# Patient Record
Sex: Female | Born: 1935 | Hispanic: No | Marital: Married | Smoking: Never smoker
Health system: Southern US, Community
[De-identification: ages and names within clinical notes are randomized; demographics above are authoritative.]

## PROBLEM LIST (undated history)

## (undated) DIAGNOSIS — H409 Unspecified glaucoma: Secondary | ICD-10-CM

## (undated) DIAGNOSIS — I4891 Unspecified atrial fibrillation: Secondary | ICD-10-CM

## (undated) DIAGNOSIS — R0602 Shortness of breath: Secondary | ICD-10-CM

## (undated) DIAGNOSIS — R42 Dizziness and giddiness: Secondary | ICD-10-CM

## (undated) DIAGNOSIS — I10 Essential (primary) hypertension: Secondary | ICD-10-CM

## (undated) DIAGNOSIS — H269 Unspecified cataract: Secondary | ICD-10-CM

## (undated) DIAGNOSIS — R609 Edema, unspecified: Secondary | ICD-10-CM

## (undated) HISTORY — DX: Edema, unspecified: R60.9

## (undated) HISTORY — DX: Shortness of breath: R06.02

## (undated) HISTORY — DX: Dizziness and giddiness: R42

## (undated) HISTORY — DX: Unspecified atrial fibrillation: I48.91

---

## 2010-10-20 DEATH — deceased

## 2017-07-19 ENCOUNTER — Emergency Department (HOSPITAL_COMMUNITY)
Admission: EM | Admit: 2017-07-19 | Discharge: 2017-07-19 | Disposition: A | Discharge status: Home / Self Care | Payer: No Typology Code available for payment source | Attending: Emergency Medicine | Admitting: Emergency Medicine

## 2017-07-19 ENCOUNTER — Encounter (HOSPITAL_COMMUNITY): Payer: Self-pay | Admitting: Emergency Medicine

## 2017-07-19 ENCOUNTER — Emergency Department (HOSPITAL_COMMUNITY): Payer: No Typology Code available for payment source

## 2017-07-19 DIAGNOSIS — Y9389 Activity, other specified: Secondary | ICD-10-CM | POA: Diagnosis not present

## 2017-07-19 DIAGNOSIS — I10 Essential (primary) hypertension: Secondary | ICD-10-CM | POA: Insufficient documentation

## 2017-07-19 DIAGNOSIS — S0990XA Unspecified injury of head, initial encounter: Secondary | ICD-10-CM

## 2017-07-19 DIAGNOSIS — Y929 Unspecified place or not applicable: Secondary | ICD-10-CM | POA: Insufficient documentation

## 2017-07-19 DIAGNOSIS — Y998 Other external cause status: Secondary | ICD-10-CM | POA: Insufficient documentation

## 2017-07-19 DIAGNOSIS — S098XXA Other specified injuries of head, initial encounter: Secondary | ICD-10-CM | POA: Diagnosis not present

## 2017-07-19 DIAGNOSIS — R000 Tachycardia, unspecified: Secondary | ICD-10-CM | POA: Diagnosis not present

## 2017-07-19 DIAGNOSIS — S0101XA Laceration without foreign body of scalp, initial encounter: Secondary | ICD-10-CM | POA: Diagnosis not present

## 2017-07-19 DIAGNOSIS — Z7982 Long term (current) use of aspirin: Secondary | ICD-10-CM | POA: Insufficient documentation

## 2017-07-19 HISTORY — DX: Unspecified glaucoma: H40.9

## 2017-07-19 HISTORY — DX: Essential (primary) hypertension: I10

## 2017-07-19 HISTORY — DX: Unspecified cataract: H26.9

## 2017-07-19 LAB — URINALYSIS, ROUTINE W REFLEX MICROSCOPIC
BACTERIA UA: NONE SEEN
Bilirubin Urine: NEGATIVE
GLUCOSE, UA: NEGATIVE mg/dL
KETONES UR: NEGATIVE mg/dL
Leukocytes, UA: NEGATIVE
Nitrite: NEGATIVE
PROTEIN: 100 mg/dL — AB
Specific Gravity, Urine: 1.011 (ref 1.005–1.030)
pH: 7 (ref 5.0–8.0)

## 2017-07-19 LAB — CBC
HCT: 44.8 % (ref 36.0–46.0)
HEMOGLOBIN: 15.3 g/dL — AB (ref 12.0–15.0)
MCH: 30.5 pg (ref 26.0–34.0)
MCHC: 34.2 g/dL (ref 30.0–36.0)
MCV: 89.4 fL (ref 78.0–100.0)
Platelets: 215 10*3/uL (ref 150–400)
RBC: 5.01 MIL/uL (ref 3.87–5.11)
RDW: 13.6 % (ref 11.5–15.5)
WBC: 14.8 10*3/uL — ABNORMAL HIGH (ref 4.0–10.5)

## 2017-07-19 LAB — BASIC METABOLIC PANEL
Anion gap: 12 (ref 5–15)
BUN: 15 mg/dL (ref 6–20)
CHLORIDE: 97 mmol/L — AB (ref 101–111)
CO2: 23 mmol/L (ref 22–32)
CREATININE: 0.78 mg/dL (ref 0.44–1.00)
Calcium: 8.9 mg/dL (ref 8.9–10.3)
GFR calc Af Amer: >60 mL/min (ref >60)
GFR calc non Af Amer: >60 mL/min (ref >60)
GLUCOSE: 135 mg/dL — AB (ref 65–99)
Potassium: 3.6 mmol/L (ref 3.5–5.1)
SODIUM: 132 mmol/L — AB (ref 135–145)

## 2017-07-19 NOTE — ED Notes (Signed)
E-signature broke; patient verbalizes understanding of discharge instructions. No further questions at this time.

## 2017-07-19 NOTE — Discharge Instructions (Addendum)
Keep wound clean and follow-up with her primary doctor. Watch for signs of infection. Take Tylenol as needed for pain.  If you were given medicines take as directed.  If you are on coumadin or contraceptives realize their levels and effectiveness is altered by many different medicines.  If you have any reaction (rash, tongues swelling, other) to the medicines stop taking and see a physician.    If your blood pressure was elevated in the ER make sure you follow up for management with a primary doctor or return for chest pain, shortness of breath or stroke symptoms.  Please follow up as directed and return to the ER or see a physician for new or worsening symptoms.  Thank you. Vitals:   07/19/17 2030 07/19/17 2100 07/19/17 2130 07/19/17 2145  BP: (!) 163/107 (!) 163/86 (!) 147/101   Pulse: (!) 105 (!) 113 (!) 123 (!) 110  Resp: (!) 27 (!) 22 (!) 22 18  Temp:      SpO2: 95% 95% 95% 98%   Have heart rate rechecked by primary doctor Monday or Tuesday.
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
## 2019-01-04 ENCOUNTER — Other Ambulatory Visit: Payer: Self-pay | Admitting: Family Medicine

## 2019-01-04 ENCOUNTER — Other Ambulatory Visit: Payer: Self-pay

## 2019-01-04 ENCOUNTER — Ambulatory Visit
Admission: RE | Admit: 2019-01-04 | Discharge: 2019-01-04 | Disposition: A | Discharge status: Home / Self Care | Payer: Medicare Other | Source: Ambulatory Visit | Attending: Family Medicine | Admitting: Family Medicine

## 2019-01-04 DIAGNOSIS — R630 Anorexia: Secondary | ICD-10-CM

## 2019-01-04 DIAGNOSIS — R6881 Early satiety: Secondary | ICD-10-CM

## 2019-01-04 DIAGNOSIS — R0602 Shortness of breath: Secondary | ICD-10-CM

## 2020-05-27 IMAGING — CR DG CHEST 2V
2 series · 2 of 2 positions shown · non-contrast
Comparison: None.

CLINICAL DATA: Shortness of breath.

EXAM:
CHEST - 2 VIEW

[w chest pa]
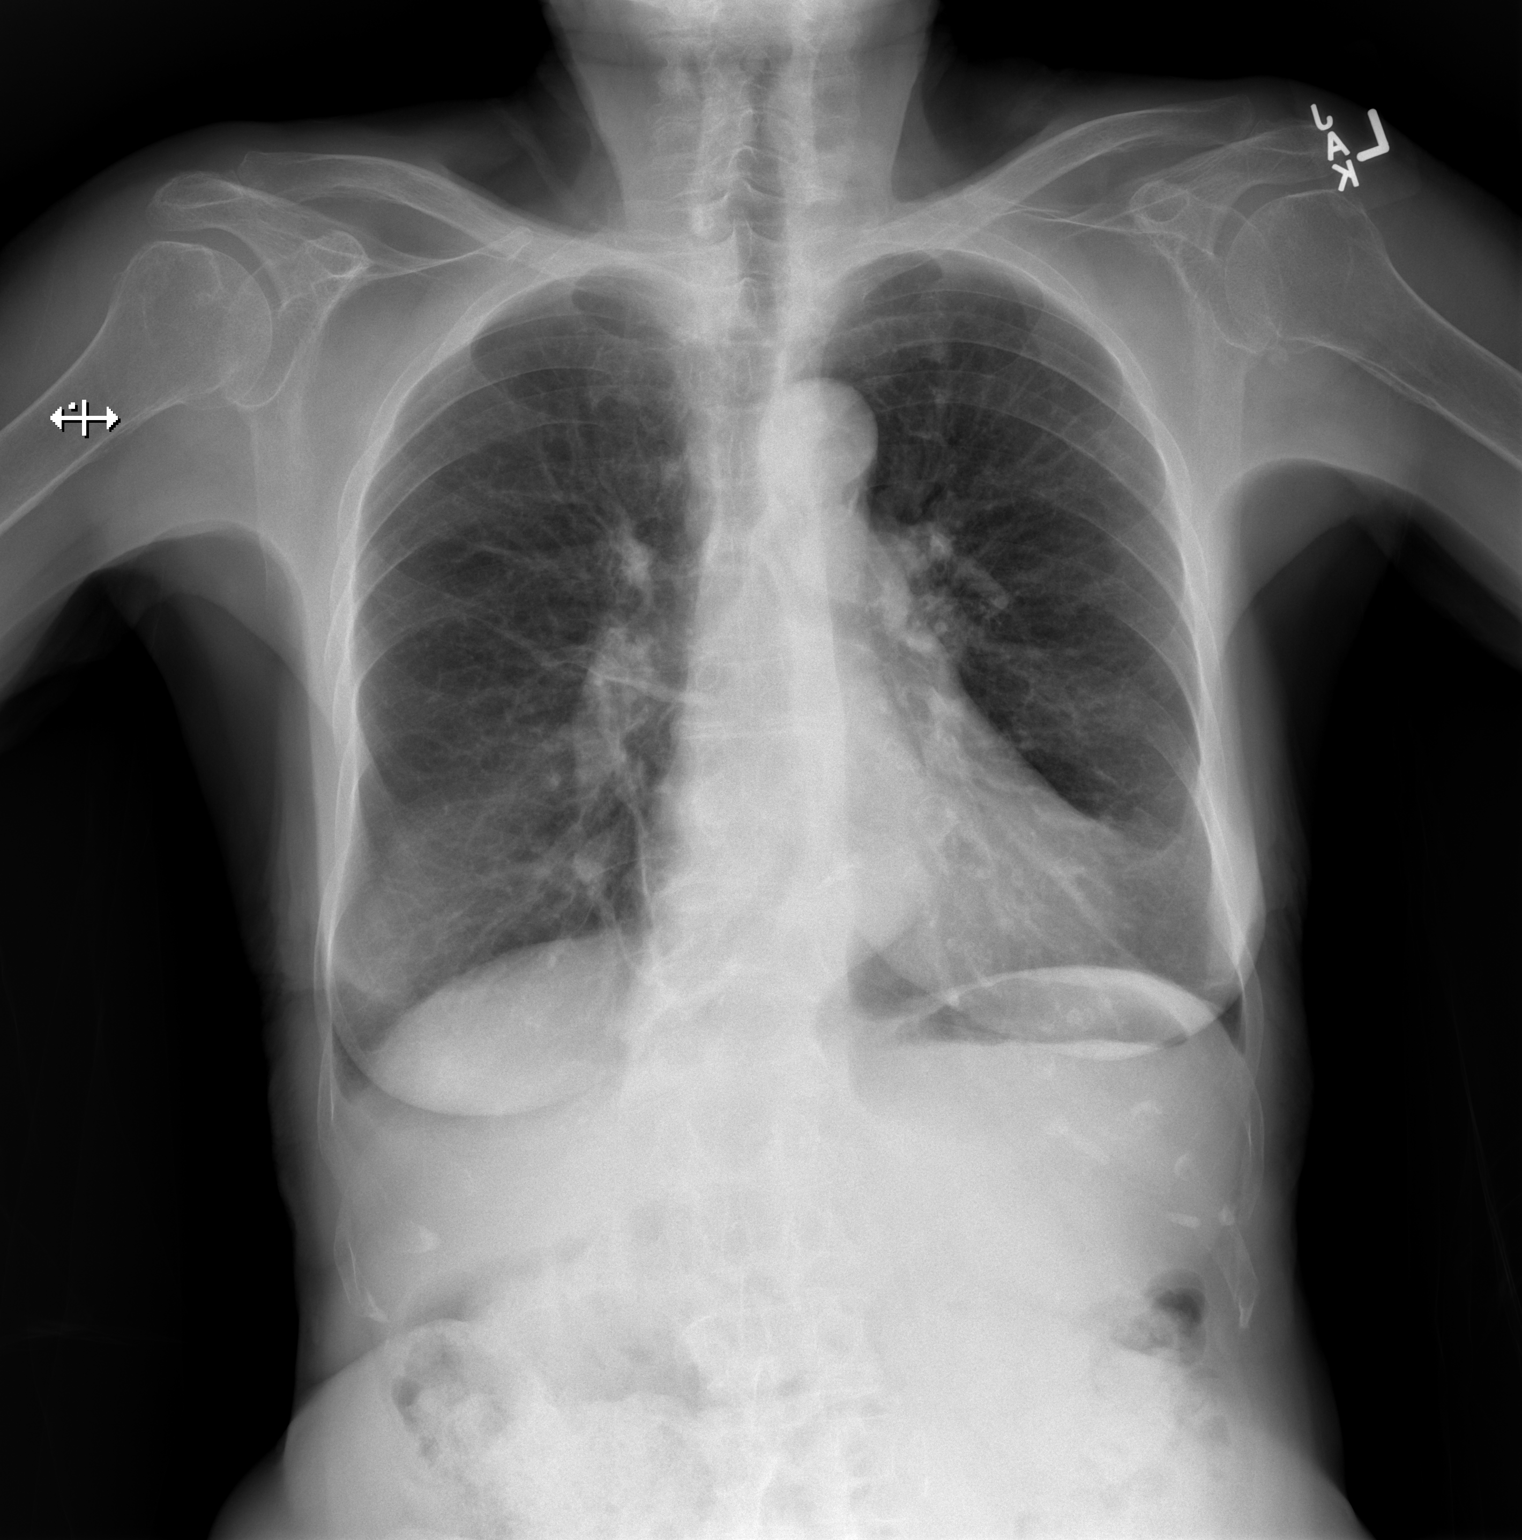

[w chest lat]
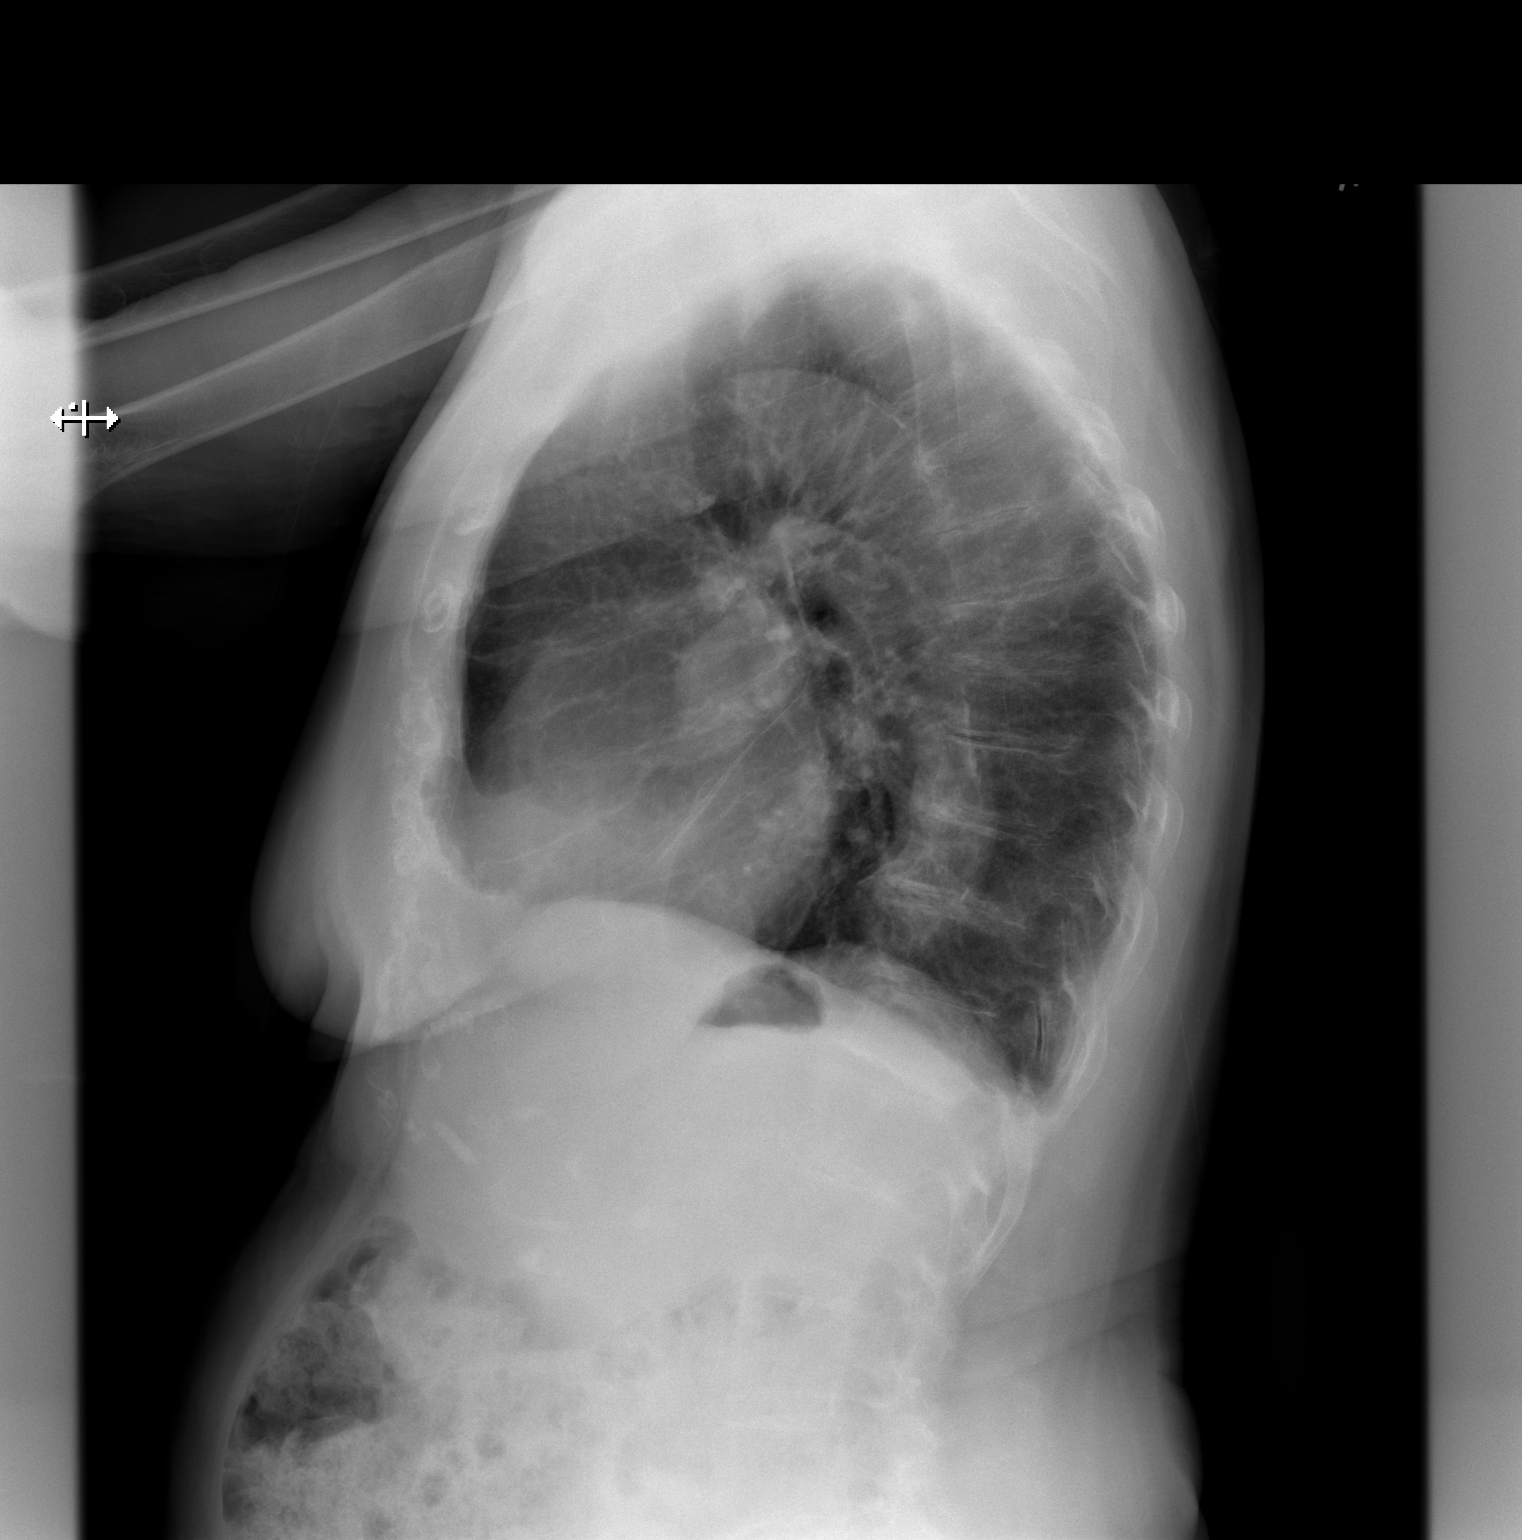

[2 of 2 positions shown; findings below may reference images not displayed]

FINDINGS: The heart size and pulmonary vascularity are normal. There is
tortuosity and calcification of the thoracic aorta. No infiltrates
or effusions. Slight accentuation of the thoracic kyphosis with
slight wedge deformities of several thoracic vertebra, age
indeterminate.
IMPRESSION: No acute cardiopulmonary disease. Slight wedge deformities of
several thoracic vertebra, age indeterminate.

## 2021-11-06 ENCOUNTER — Other Ambulatory Visit (HOSPITAL_COMMUNITY): Payer: Self-pay | Admitting: Family Medicine

## 2021-11-06 DIAGNOSIS — I4892 Unspecified atrial flutter: Secondary | ICD-10-CM

## 2021-11-08 ENCOUNTER — Telehealth: Payer: Self-pay

## 2021-11-08 NOTE — Telephone Encounter (Signed)
Notes scanned to referral 

## 2021-11-13 ENCOUNTER — Encounter (HOSPITAL_COMMUNITY): Payer: Self-pay

## 2021-11-13 ENCOUNTER — Other Ambulatory Visit (HOSPITAL_COMMUNITY): Payer: Medicare Other

## 2021-11-13 ENCOUNTER — Emergency Department (HOSPITAL_COMMUNITY): Payer: Medicare PPO

## 2021-11-13 ENCOUNTER — Observation Stay (HOSPITAL_COMMUNITY)
Admission: EM | Admit: 2021-11-13 | Discharge: 2021-11-14 | Disposition: A | Discharge status: Home / Self Care | Payer: Medicare PPO | Attending: Internal Medicine | Admitting: Internal Medicine

## 2021-11-13 ENCOUNTER — Other Ambulatory Visit: Payer: Self-pay

## 2021-11-13 DIAGNOSIS — I110 Hypertensive heart disease with heart failure: Secondary | ICD-10-CM | POA: Diagnosis not present

## 2021-11-13 DIAGNOSIS — I4891 Unspecified atrial fibrillation: Principal | ICD-10-CM | POA: Diagnosis present

## 2021-11-13 DIAGNOSIS — Z79899 Other long term (current) drug therapy: Secondary | ICD-10-CM | POA: Insufficient documentation

## 2021-11-13 DIAGNOSIS — I509 Heart failure, unspecified: Secondary | ICD-10-CM | POA: Diagnosis not present

## 2021-11-13 DIAGNOSIS — Z7901 Long term (current) use of anticoagulants: Secondary | ICD-10-CM | POA: Diagnosis not present

## 2021-11-13 DIAGNOSIS — R0602 Shortness of breath: Secondary | ICD-10-CM | POA: Diagnosis present

## 2021-11-13 LAB — BASIC METABOLIC PANEL
Anion gap: 12 (ref 5–15)
BUN: 13 mg/dL (ref 8–23)
CO2: 28 mmol/L (ref 22–32)
Calcium: 9 mg/dL (ref 8.9–10.3)
Chloride: 94 mmol/L — ABNORMAL LOW (ref 98–111)
Creatinine, Ser: 0.78 mg/dL (ref 0.44–1.00)
GFR, Estimated: >60 mL/min (ref >60)
Glucose, Bld: 136 mg/dL — ABNORMAL HIGH (ref 70–99)
Potassium: 3.4 mmol/L — ABNORMAL LOW (ref 3.5–5.1)
Sodium: 134 mmol/L — ABNORMAL LOW (ref 135–145)

## 2021-11-13 LAB — TROPONIN I (HIGH SENSITIVITY)
Troponin I (High Sensitivity): 16 ng/L (ref <18)
Troponin I (High Sensitivity): 15 ng/L (ref <18)

## 2021-11-13 LAB — CBC
HCT: 43.8 % (ref 36.0–46.0)
Hemoglobin: 15 g/dL (ref 12.0–15.0)
MCH: 30.7 pg (ref 26.0–34.0)
MCHC: 34.2 g/dL (ref 30.0–36.0)
MCV: 89.8 fL (ref 80.0–100.0)
Platelets: 309 10*3/uL (ref 150–400)
RBC: 4.88 MIL/uL (ref 3.87–5.11)
RDW: 13.5 % (ref 11.5–15.5)
WBC: 13 10*3/uL — ABNORMAL HIGH (ref 4.0–10.5)
nRBC: 0 % (ref 0.0–0.2)

## 2021-11-13 LAB — MAGNESIUM: Magnesium: 2.1 mg/dL (ref 1.7–2.4)

## 2021-11-13 LAB — BRAIN NATRIURETIC PEPTIDE: B Natriuretic Peptide: 273.9 pg/mL — ABNORMAL HIGH (ref 0.0–100.0)

## 2021-11-13 LAB — TSH: TSH: 0.704 u[IU]/mL (ref 0.350–4.500)

## 2021-11-13 MED ORDER — ACETAMINOPHEN 325 MG PO TABS: 650.0000 mg | ORAL_TABLET | Freq: Four times a day (QID) | ORAL | Status: DC | PRN

## 2021-11-13 MED ORDER — DILTIAZEM HCL-DEXTROSE 125-5 MG/125ML-% IV SOLN (PREMIX)
5.0000 mg/h | INTRAVENOUS | Status: DC
  Administered 2021-11-13 (×2): 5 mg/h via INTRAVENOUS
  Administered 2021-11-14: 7.5 mg/h via INTRAVENOUS
  Filled 2021-11-13 (×2): qty 125

## 2021-11-13 MED ORDER — DILTIAZEM LOAD VIA INFUSION
10.0000 mg | Freq: Once | INTRAVENOUS | Status: AC
  Administered 2021-11-13: 10 mg via INTRAVENOUS
  Filled 2021-11-13: qty 10

## 2021-11-13 MED ORDER — HYDRALAZINE HCL 20 MG/ML IJ SOLN: 10.0000 mg | Freq: Four times a day (QID) | INTRAMUSCULAR | Status: DC | PRN

## 2021-11-13 MED ORDER — ALBUTEROL SULFATE (2.5 MG/3ML) 0.083% IN NEBU
2.5000 mg | INHALATION_SOLUTION | Freq: Four times a day (QID) | RESPIRATORY_TRACT | Status: DC
  Filled 2021-11-13: qty 3

## 2021-11-13 MED ORDER — APIXABAN 2.5 MG PO TABS
2.5000 mg | ORAL_TABLET | Freq: Two times a day (BID) | ORAL | Status: DC
Start: 2021-11-13 — End: 2021-11-14
  Filled 2021-11-13 (×2): qty 1

## 2021-11-13 MED ORDER — POTASSIUM CHLORIDE 20 MEQ PO PACK
40.0000 meq | PACK | Freq: Once | ORAL | Status: AC
  Administered 2021-11-13: 40 meq via ORAL
  Filled 2021-11-13: qty 2

## 2021-11-13 MED ORDER — METOPROLOL SUCCINATE ER 25 MG PO TB24: 12.5000 mg | ORAL_TABLET | Freq: Every day | ORAL | Status: DC

## 2021-11-13 MED ORDER — METOPROLOL TARTRATE 12.5 MG HALF TABLET
12.5000 mg | ORAL_TABLET | Freq: Two times a day (BID) | ORAL | Status: DC
  Administered 2021-11-13 – 2021-11-14 (×2): 12.5 mg via ORAL
  Filled 2021-11-13 (×2): qty 1

## 2021-11-13 MED ORDER — ACETAMINOPHEN 650 MG RE SUPP: 650.0000 mg | Freq: Four times a day (QID) | RECTAL | Status: DC | PRN

## 2021-11-13 MED ORDER — PILOCARPINE HCL 2 % OP SOLN
1.0000 [drp] | Freq: Four times a day (QID) | OPHTHALMIC | Status: DC
Start: 2021-11-13 — End: 2021-11-14
  Administered 2021-11-14: 1 [drp] via OPHTHALMIC
  Filled 2021-11-13: qty 15

## 2021-11-13 MED ORDER — FUROSEMIDE 10 MG/ML IJ SOLN
20.0000 mg | Freq: Once | INTRAMUSCULAR | Status: AC
  Administered 2021-11-13: 20 mg via INTRAVENOUS
  Filled 2021-11-13: qty 2

## 2021-11-13 MED ORDER — ALBUTEROL SULFATE (2.5 MG/3ML) 0.083% IN NEBU: 2.5000 mg | INHALATION_SOLUTION | RESPIRATORY_TRACT | Status: DC | PRN

## 2021-11-13 NOTE — ED Notes (Signed)
Requested purple man from [facility]

## 2021-11-13 NOTE — ED Provider Notes (Signed)
  Physical Exam  BP 136/88   Pulse 89   Temp 98.9 F (37.2 C) (Oral)   Resp (!) 21   Ht 4\' 9"  (1.448 m)   Wt 50.8 kg   SpO2 97%   BMI 24.24 kg/m   Physical Exam  Procedures  Procedures  ED Course / MDM      Received care of patient from Dr. .  Please see his note for prior history, physical and care.  Briefly this is a 88 year old female with a history of hypertension, CHF, and atrial fibrillation on Eliquis who presents with concern for shortness of breath, fatigue and chest pain.  On presentation she is in atrial fibrillation with RVR with started on a diltiazem drip.  Labs returned and were also personally eval by me and showed mild hypokalemia, normal creatinine, white blood cell count of 13,000, no anemia, normal troponin.  Chest x-ray with small bilateral pleural effusions and bibasilar opacities.  Admitted for continued care of atrial fibrillation, dyspnea, cp.      94, MD 11/14/21 [phone]

## 2021-11-13 NOTE — ED Provider Notes (Addendum)
[facility] EMERGENCY DEPARTMENT Provider Note   CSN: [id number] Arrival date & time: 11/13/21  1253     History  Chief Complaint  Patient presents with   Shortness of Breath   Chest Pain    Stacy Ortega is a 88 y.o. female.   Shortness of Breath Associated symptoms: chest pain   Chest Pain Associated symptoms: fatigue and shortness of breath   Presents for chest pain and shortness of breath.  Medical history includes HTN, CHF, and atrial fibrillation.  She has had ongoing shortness of breath, worsened with exertion and laying flat.  She has been prescribed Lasix, Eliquis, and metoprolol.  These are recent prescriptions.  She states that she does not like taking them.  She has been taking her Lasix and has not experienced any relief of her symptoms.  Last night, she experienced left-sided chest pain and shortness of breath.  Chest pain radiated into her left arm.  Symptoms have persisted today.  Currently, she denies any pain or areas of discomfort.  She feels that her shortness of breath is baseline.  Patient presents to the ED with her daughters who assist in providing history.  Patient is very clear that she would like to be DNR and has no intention of staying in the hospital tonight.     Home Medications Prior to Admission medications   Medication Sig Start Date End Date Taking? Authorizing Provider  amLODipine (NORVASC) 2.5 MG tablet Take 2.5 mg by mouth as needed.    [provider]  aspirin 325 MG tablet Take 325 mg by mouth at bedtime.    [provider]  latanoprost (XALATAN) 0.005 % ophthalmic solution Place 1 drop into both eyes at bedtime. 06/10/17   [provider]  lisinopril (PRINIVIL,ZESTRIL) 20 MG tablet Take 20 mg by mouth daily. 05/11/17   [provider]  pilocarpine (PILOCAR) 2 % ophthalmic solution Place 1 drop into both eyes 4 (four) times daily. 06/26/17   [provider]      Allergies     Amoxicillin, Codeine, and Morphine and related    Review of Systems   Review of Systems  Constitutional:  Positive for fatigue.  Respiratory:  Positive for shortness of breath.   Cardiovascular:  Positive for chest pain and leg swelling.  All other systems reviewed and are negative.   Physical Exam Updated Vital Signs BP 125/84   Pulse (!) 106   Temp 98.9 F (37.2 C) (Oral)   Resp (!) 25   Ht 4\' 9"  (1.448 m)   Wt 50.8 kg   SpO2 97%   BMI 24.24 kg/m  Physical Exam Vitals and nursing note reviewed.  Constitutional:      General: She is not in acute distress.    Appearance: She is well-developed and normal weight. She is not ill-appearing, toxic-appearing or diaphoretic.  HENT:     Head: Normocephalic and atraumatic.     Mouth/Throat:     Mouth: Mucous membranes are moist.     Pharynx: Oropharynx is clear.  Eyes:     Conjunctiva/sclera: Conjunctivae normal.  Cardiovascular:     Rate and Rhythm: Tachycardia present. Rhythm irregular.     Heart sounds: No murmur heard. Pulmonary:     Effort: Pulmonary effort is normal. Tachypnea present. No respiratory distress.     Breath sounds: Rales present. No decreased breath sounds, wheezing or rhonchi.  Chest:     Chest wall: No tenderness.  Abdominal:  Palpations: Abdomen is soft.     Tenderness: There is no abdominal tenderness.  Musculoskeletal:        General: No swelling.     Cervical back: Normal range of motion and neck supple.     Right lower leg: Edema present.     Left lower leg: Edema present.  Skin:    General: Skin is warm and dry.     Capillary Refill: Capillary refill takes less than 2 seconds.     Coloration: Skin is not cyanotic or pale.  Neurological:     General: No focal deficit present.     Mental Status: She is alert and oriented to person, place, and time.  Psychiatric:        Mood and Affect: Mood normal.        Behavior: Behavior normal.     ED Results / Procedures / Treatments    Labs (all labs ordered are listed, but only abnormal results are displayed) Labs Reviewed  BASIC METABOLIC PANEL - Abnormal; Notable for the following components:      Result Value   Sodium 134 (*)    Potassium 3.4 (*)    Chloride 94 (*)    Glucose, Bld 136 (*)    All other components within normal limits  CBC - Abnormal; Notable for the following components:   WBC 13.0 (*)    All other components within normal limits  BRAIN NATRIURETIC PEPTIDE  MAGNESIUM  TSH  TROPONIN I (HIGH SENSITIVITY)  TROPONIN I (HIGH SENSITIVITY)    EKG EKG Interpretation  Date/Time:  Wednesday November 13 2021 13:55:45 EDT Ventricular Rate:  155 PR Interval:    QRS Duration: 66 QT Interval:  270 QTC Calculation: 433 R Axis:   72 Text Interpretation: Atrial fibrillation w/RVR Septal infarct , age undetermined Abnormal ECG Confirmed by Godfrey Pick [phone]) on 11/13/2021 2:47:11 PM  Radiology DG Chest 2 View  Result Date: 11/13/2021 CLINICAL DATA:  Shortness of breath EXAM: CHEST - 2 VIEW COMPARISON:  01/04/2019 FINDINGS: Cardiomegaly. Small bilateral pleural effusions, left greater than right, with associated bibasilar opacities. No pneumothorax. IMPRESSION: Small bilateral pleural effusions, left greater than right, with associated bibasilar opacities. Electronically Signed   By: Davina Poke D.O.   On: 11/13/2021 14:30    Procedures Procedures    Medications Ordered in ED Medications  diltiazem (CARDIZEM) 1 mg/mL load via infusion 10 mg (10 mg Intravenous Bolus from Bag 11/13/21 1542)    And  diltiazem (CARDIZEM) 125 mg in dextrose 5% 125 mL (1 mg/mL) infusion (7.5 mg/hr Intravenous Rate/Dose Change 11/13/21 1643)  potassium chloride (KLOR-CON) packet 40 mEq (has no administration in time range)  furosemide (LASIX) injection 20 mg (has no administration in time range)    ED Course/ Medical Decision Making/ A&P                           Medical Decision Making Amount and/or Complexity of Data  Reviewed Labs: ordered. Radiology: ordered.  Risk Prescription drug management.   This patient presents to the ED for concern of shortness of breath, this involves an extensive number of treatment options, and is a complaint that carries with it a high risk of complications and morbidity.  The differential diagnosis includes pulm edema, CHF, valvular dysfunction, atrial fibrillation, ACS, pneumonia   Co morbidities that complicate the patient evaluation  HTN, CHF, and atrial fibrillation   Additional history obtained:  Additional history obtained from patient's  daughters External records from outside source obtained and reviewed including EMR   Lab Tests:  I Ordered, and personally interpreted labs.  The pertinent results include: Hemoglobin is normal.  There is a leukocytosis of 13.  Potassium is slightly low.  Initial troponin is normal.   Imaging Studies ordered:  I ordered imaging studies including chest x-ray I independently visualized and interpreted imaging which showed small bilateral pleural effusions I agree with the radiologist interpretation   Cardiac Monitoring: / EKG:  The patient was maintained on a cardiac monitor.  I personally viewed and interpreted the cardiac monitored which showed an underlying rhythm of: Atrial fibrillation with RVR  Problem List / ED Course / Critical interventions / Medication management  Patient is a very pleasant 88 year old female presenting for shortness of breath.  This has been an ongoing issue for her.  She is seen by her primary care doctor and has recently been prescribed Eliquis for atrial fibrillation, Lasix for diuresis, and metoprolol for CHF and A-fib.  She did have an episode of chest pain last night that radiated into her left arm.  Currently, she denies any discomfort but does endorse her ongoing shortness of breath that she does not feel has worsened.  Vital signs on arrival are notable for tachycardia.  EKG does show  evidence of atrial fibrillation with RVR.  She is mildly tachypneic.  Fortunately, her blood pressures are normal.  Patient is able to speak in complete sentences.  She is a joy to talk to.  Initially, patient was hesitant to receive any medical care.  She stated that she has lived a good life and does not want to be treated with medications or in the hospital.  After discussion regarding her rapid heart rate at this time, patient was agreeable to initiation of Cardizem for rate control.  Laboratory work-up was initiated.  Labs show mild hypokalemia.  Replacement potassium was ordered in addition to Lasix for diuresis.  She does have dilated atria on bedside echocardiogram.  X-ray also shows some bilateral pleural effusions.  I feel that patient would benefit from continued diuresis and continued rate control.  After further discussions, she was amenable to an overnight admission for observation and continued management.  Following initiation of Cardizem, patient did have improved heart rate.  She also reported improved symptoms.  Plan will be for admission.  Care of patient was signed out to oncoming ED provider. I ordered medication including Cardizem for rate control; potassium chloride for hypokalemia; Lasix for diuresis Reevaluation of the patient after these medicines showed that the patient improved I have reviewed the patients home medicines and have made adjustments as needed   Social Determinants of Health:  Has primary care doctor  CRITICAL CARE Performed by: Gloris Manchester   Total critical care time: 35 minutes  Critical care time was exclusive of separately billable procedures and treating other patients.  Critical care was necessary to treat or prevent imminent or life-threatening deterioration.  Critical care was time spent personally by me on the following activities: development of treatment plan with patient and/or surrogate as well as nursing, discussions with consultants,  evaluation of patient's response to treatment, examination of patient, obtaining history from patient or surrogate, ordering and performing treatments and interventions, ordering and review of laboratory studies, ordering and review of radiographic studies, pulse oximetry and re-evaluation of patient's condition.         Final Clinical Impression(s) / ED Diagnoses Final diagnoses:  Shortness of breath  Atrial  fibrillation with RVR [facility])    Rx / DC Orders ED Discharge Orders          Ordered    Amb referral to AFIB Clinic        11/13/21 1439                 Godfrey Pick, MD 11/13/21 1649

## 2021-11-13 NOTE — ED Notes (Signed)
Sob with exertion

## 2021-11-13 NOTE — H&P (Signed)
[facility] History and Physical  Stacy Ortega HKV:[id number] DOB: 05/07/1922 DOA: 11/13/2021 PCP: Kaleen Mask, MD  Admitted from: Home Chief Complaint: Chest pain  History of Present Illness: Stacy Ortega is a 88 y.o. female with history of hypertension, CHF, glaucoma, cataract.  Patient presented to the ED from home today for evaluation of shortness of breath, left-sided chest pain.  Per report, patient became short of breath last night while in bed.  She also had chest pain radiating up to her jaw. She reports chronic shortness of breath but last night and orthopnea was more intense.  In the ED, patient had heart rate elevated up to 161, blood pressure elevated to 164/116, breathing on room air with O2 sat at 95% Labs showed sodium at 134, potassium 3.4, BNP elevated to 274, WBC count slightly elevated to 13. Chest x-ray showed small bilateral pleural effusions, left greater than right, with associated bibasilar opacities.  Patient was given a dose of IV Lasix 20 mg and started on Cardizem drip.  At the time of my evaluation, patient was propped up in bed.  Alert, awake, oriented x3.  Little hard of hearing.  Daughter was at bedside and helped with the history. Patient had not seen a physician for several years.  She was taking lisinopril and aspirin 325 mg daily. Patient was recently seen by her primary care provider and was noted to be in A-fib.  She was also noted to have pedal edema.  She was switched to metoprolol succinate 12.5 mg daily, Eliquis 2.5 twice daily and Lasix 20 mg daily.  Patient does not like these medications but she was compliant to them anyway.   Review of Systems:  All systems were reviewed and were negative unless otherwise mentioned in the HPI   Past medical history: Past Medical History:  Diagnosis Date   Cataract    Glaucoma    Hypertension     Past surgical history: History reviewed. No pertinent surgical history.  Social History:   reports that she has never smoked. She has never used smokeless tobacco. She reports that she does not currently use alcohol. She reports that she does not currently use drugs.  Allergies:  Allergies  Allergen Reactions   Amoxicillin Anaphylaxis   Codeine Nausea And Vomiting   Morphine And Related Nausea And Vomiting   Amoxicillin, Codeine, and Morphine and related   Family history:  History reviewed. No pertinent family history.   Home Meds: Prior to Admission medications   Medication Sig Start Date End Date Taking? Authorizing Provider  ELIQUIS 2.5 MG TABS tablet Take 2.5 mg by mouth every 12 (twelve) hours. 11/01/21  Yes [provider]  furosemide (LASIX) 20 MG tablet Take 20 mg by mouth daily. 11/08/21  Yes [provider]  latanoprost (XALATAN) 0.005 % ophthalmic solution Place 1 drop into both eyes at bedtime. 06/10/17  Yes [provider]  metoprolol succinate (TOPROL-XL) 25 MG 24 hr tablet Take 12.5 mg by mouth daily. 11/01/21  Yes [provider]  pilocarpine (PILOCAR) 2 % ophthalmic solution Place 1 drop into both eyes 4 (four) times daily. 06/26/17  Yes [provider]    Physical Exam: Vitals:   11/13/21 1515 11/13/21 1545 11/13/21 1600 11/13/21 1630  BP: (!) 146/107 136/88 136/85 125/84  Pulse: (!) 160 89 (!) 106 (!) 106  Resp: (!) 23 (!) 21 (!) 27 (!) 25  Temp:      TempSrc:      SpO2: 98% 97% 98%  97%  Weight:      Height:       Wt Readings from Last 3 Encounters:  11/13/21 50.8 kg   Body mass index is 24.24 kg/m.  General exam: Pleasant, elderly Caucasian female.  No chest pain at the time of my evaluation Skin: No rashes, lesions or ulcers. HEENT: Atraumatic, normocephalic, no obvious bleeding Lungs: Clear to auscultate bilaterally CVS: A-fib with RVR in 120s GI/Abd soft, nontender, nondistended, bowel sound present CNS: Alert, awake abound x3 Psychiatry: Mood appropriate Extremities: Trace bilateral pedal  edema     Consult Orders  (From admission, onward)           Start     Ordered   11/13/21 1647  Consult to hospitalist  Once       Provider:  (Not yet assigned)  Question Answer Comment  Place call to: [facility]   Reason for Consult Admit      11/13/21 1646            Labs on Admission:   CBC: Recent Labs  Lab 11/13/21 1403  WBC 13.0*  HGB 15.0  HCT 43.8  MCV 89.8  PLT [id number]    Basic Metabolic Panel: Recent Labs  Lab 11/13/21 1403 11/13/21 1439  NA 134*  --   K 3.4*  --   CL 94*  --   CO2 28  --   GLUCOSE 136*  --   BUN 13  --   CREATININE 0.78  --   CALCIUM 9.0  --   MG  --  2.1    Liver Function Tests: No results for input(s): "AST", "ALT", "ALKPHOS", "BILITOT", "PROT", "ALBUMIN" in the last 168 hours. No results for input(s): "LIPASE", "AMYLASE" in the last 168 hours. No results for input(s): "AMMONIA" in the last 168 hours.  Cardiac Enzymes: No results for input(s): "CKTOTAL", "CKMB", "CKMBINDEX", "TROPONINI" in the last 168 hours.  BNP (last 3 results) Recent Labs    11/13/21 1403  BNP 273.9*    ProBNP (last 3 results) No results for input(s): "PROBNP" in the last 8760 hours.  CBG: No results for input(s): "GLUCAP" in the last 168 hours.  Lipase  No results found for: "LIPASE"   Urinalysis    Component Value Date/Time   COLORURINE STRAW (A) 07/19/2017 2156   APPEARANCEUR CLEAR 07/19/2017 2156   LABSPEC 1.011 07/19/2017 2156   PHURINE 7.0 07/19/2017 2156   GLUCOSEU NEGATIVE 07/19/2017 2156   HGBUR SMALL (A) 07/19/2017 2156   [place] NEGATIVE 07/19/2017 2156   [place] NEGATIVE 07/19/2017 2156   PROTEINUR 100 (A) 07/19/2017 2156   NITRITE NEGATIVE 07/19/2017 2156   LEUKOCYTESUR NEGATIVE 07/19/2017 2156     Drugs of Abuse  No results found for: "LABOPIA", "COCAINSCRNUR", "LABBENZ", "AMPHETMU", "THCU", "LABBARB"    Radiological Exams on Admission: DG Chest 2 View  Result Date: 11/13/2021 CLINICAL DATA:   Shortness of breath EXAM: CHEST - 2 VIEW COMPARISON:  01/04/2019 FINDINGS: Cardiomegaly. Small bilateral pleural effusions, left greater than right, with associated bibasilar opacities. No pneumothorax. IMPRESSION: Small bilateral pleural effusions, left greater than right, with associated bibasilar opacities. Electronically Signed   By: Davina Poke D.O.   On: 11/13/2021 14:30     ------------------------------------------------------------------------------------------------------ Assessment/Plan: Principal Problem:   Atrial fibrillation with RVR (HCC)  A-fib with RVR -Started having chest pain, shortness of breath last night.  Probably it was the time of onset of RVR. -PTA on metoprolol succinate 12.5 mg daily and Eliquis 2.5 L twice daily -  In the ED, she was started on Cardizem drip.  I will continue the same for now.  I will also start her on metoprolol tartrate 12.5 mg twice daily -On Eliquis for anticoagulation.  Essential hypertension -Continue to monitor blood pressure while on Cardizem drip, metoprolol. -Hydralazine as needed  Impaired mobility -PT eval  Goals of care - -  Code Status: DNR    Diet:  Diet Order             Diet Heart Room service appropriate? Yes; Fluid consistency: Thin  Diet effective now                  DVT prophylaxis:  apixaban (ELIQUIS) tablet 2.5 mg Start: 11/13/21 2200 apixaban (ELIQUIS) tablet 2.5 mg   Antimicrobials:  Fluid: None Consultants: None Family Communication: Daughter at bedside Dispo: The patient is from: Home              Anticipated d/c is to: Home hopefully tomorrow    ------------------------------------------------------------------------------------- Severity of Illness: The appropriate patient status for this patient is OBSERVATION. Observation status is judged to be reasonable and necessary in order to provide the required intensity of service to ensure the patient's safety. The patient's presenting  symptoms, physical exam findings, and initial radiographic and laboratory data in the context of their medical condition is felt to place them at decreased risk for further clinical deterioration. Furthermore, it is anticipated that the patient will be medically stable for discharge from the hospital within 2 midnights of admission.   -------------------------------------------------------------------------------------  Signed, Lorin Glass, MD [facility] 11/13/2021

## 2021-11-13 NOTE — ED Provider Triage Note (Signed)
Emergency Medicine Provider Triage Evaluation Note  Stacy Ortega , a 88 y.o. female  was evaluated in triage.  Pt complains of chest pain and shortness of breath.  States that same began last night.  States she was recently started on Lasix and Eliquis for A-fib and CHF.  Daughter is present in the room states that she does not like taking medication and so she does not always take either of these medications.  States she suspects she is taking her Eliquis once a day instead of the prescribed twice a day regimen.  Review of Systems  Positive:  Negative:  Physical Exam  BP (!) 156/79   Pulse (!) 147   Temp 98.9 F (37.2 C) (Oral)   Resp (!) 22   Ht 4\' 9"  (1.448 m)   Wt 50.8 kg   SpO2 95%   BMI 24.24 kg/m  Gen:   Awake, no distress   Resp:  Normal effort  MSK:   Moves extremities without difficulty  Other:    Medical Decision Making  Medically screening exam initiated at 2:03 PM.  Appropriate orders placed.  Stacy Ortega was informed that the remainder of the evaluation will be completed by another provider, this initial triage assessment does not replace that evaluation, and the importance of remaining in the ED until their evaluation is complete.  Afib RVR with rate in the 160s, will need next room. Nursing staff aware   Lavonna Rua, PA-C 11/13/21 1405

## 2021-11-13 NOTE — Progress Notes (Signed)
Patient brought to 4E from ED. VSS. Telemetry box applied, CCMD notified. Patient oriented to room and staff. Call bell in reach.  [place] C Alexius Ellington, RN

## 2021-11-13 NOTE — ED Triage Notes (Signed)
Pt arrives POV w/ daughter for eval of SOB and L sided chest pain. Pt reports that last night she became SOB in bed, noted L sided chest pain w/ radiation up into jaw. States her abd feels distended. Hx of CHF, reports chronic SOB but last night orthopnea was more intense.

## 2021-11-14 ENCOUNTER — Observation Stay (HOSPITAL_BASED_OUTPATIENT_CLINIC_OR_DEPARTMENT_OTHER): Payer: Medicare PPO

## 2021-11-14 DIAGNOSIS — I4891 Unspecified atrial fibrillation: Secondary | ICD-10-CM | POA: Diagnosis not present

## 2021-11-14 DIAGNOSIS — R9431 Abnormal electrocardiogram [ECG] [EKG]: Secondary | ICD-10-CM | POA: Diagnosis not present

## 2021-11-14 LAB — ECHOCARDIOGRAM COMPLETE
Area-P 1/2: 3.5 cm2
Height: 57 in
MV M vel: 4.59 m/s
MV Peak grad: 84.1 mmHg
S' Lateral: 3.9 cm
Weight: 1792 oz

## 2021-11-14 LAB — BASIC METABOLIC PANEL
Anion gap: 6 (ref 5–15)
BUN: 11 mg/dL (ref 8–23)
CO2: 31 mmol/L (ref 22–32)
Calcium: 8.9 mg/dL (ref 8.9–10.3)
Chloride: 96 mmol/L — ABNORMAL LOW (ref 98–111)
Creatinine, Ser: 0.83 mg/dL (ref 0.44–1.00)
GFR, Estimated: >60 mL/min (ref >60)
Glucose, Bld: 136 mg/dL — ABNORMAL HIGH (ref 70–99)
Potassium: 3.4 mmol/L — ABNORMAL LOW (ref 3.5–5.1)
Sodium: 133 mmol/L — ABNORMAL LOW (ref 135–145)

## 2021-11-14 LAB — CBC
HCT: 39.4 % (ref 36.0–46.0)
Hemoglobin: 13.4 g/dL (ref 12.0–15.0)
MCH: 30.5 pg (ref 26.0–34.0)
MCHC: 34 g/dL (ref 30.0–36.0)
MCV: 89.7 fL (ref 80.0–100.0)
Platelets: 281 10*3/uL (ref 150–400)
RBC: 4.39 MIL/uL (ref 3.87–5.11)
RDW: 13.5 % (ref 11.5–15.5)
WBC: 10 10*3/uL (ref 4.0–10.5)
nRBC: 0 % (ref 0.0–0.2)

## 2021-11-14 MED ORDER — METOPROLOL SUCCINATE ER 25 MG PO TB24
25.0000 mg | ORAL_TABLET | Freq: Every day | ORAL | 2 refills | Status: DC
Start: 2021-11-14 — End: 2021-11-15

## 2021-11-14 MED ORDER — ASPIRIN 325 MG PO TBEC: 325.0000 mg | DELAYED_RELEASE_TABLET | Freq: Every day | ORAL | 2 refills | Status: AC

## 2021-11-14 MED ORDER — POTASSIUM CHLORIDE 20 MEQ PO PACK
40.0000 meq | PACK | Freq: Once | ORAL | Status: AC
  Administered 2021-11-14: 40 meq via ORAL
  Filled 2021-11-14: qty 2

## 2021-11-14 MED ORDER — FUROSEMIDE 20 MG PO TABS: 20.0000 mg | ORAL_TABLET | Freq: Every day | ORAL | Status: AC | PRN

## 2021-11-14 NOTE — Plan of Care (Signed)
  Problem: Clinical Measurements: Goal: Ability to maintain clinical measurements within normal limits will improve Outcome: Adequate for Discharge Goal: Will remain free from infection Outcome: Adequate for Discharge Goal: Diagnostic test results will improve Outcome: Adequate for Discharge Goal: Respiratory complications will improve Outcome: Adequate for Discharge Goal: Cardiovascular complication will be avoided Outcome: Adequate for Discharge   Problem: Activity: Goal: Risk for activity intolerance will decrease Outcome: Adequate for Discharge   Problem: Pain Managment: Goal: General experience of comfort will improve Outcome: Adequate for Discharge   Problem: Activity: Goal: Risk for activity intolerance will decrease Outcome: Adequate for Discharge   Problem: Nutrition: Goal: Adequate nutrition will be maintained Outcome: Adequate for Discharge   Problem: Pain Managment: Goal: General experience of comfort will improve Outcome: Adequate for Discharge   Problem: Safety: Goal: Ability to remain free from injury will improve Outcome: Adequate for Discharge   Problem: Skin Integrity: Goal: Risk for impaired skin integrity will decrease Outcome: Adequate for Discharge

## 2021-11-14 NOTE — Progress Notes (Signed)
Mobility Specialist: Progress Note   11/14/21 1225  Mobility  Activity Ambulated with assistance in hallway  Level of Assistance Contact guard assist, steadying assist  Assistive Device Other (Comment) (HHA)  Distance Ambulated (ft) 240 ft  Activity Response Tolerated well  $Mobility charge 1 Mobility   Pre-Mobility: 80 HR Post-Mobility: 94 HR  Received pt in bed having no complaints and agreeable to mobility. Mild unsteadiness requiring HHA throughout with c/o feeling tired. Pt was asymptomatic throughout ambulation and returned to room w/o fault. Left in bed w/ call bell in reach and all needs met.  [facility] Lylia Karn Mobility Specialist Mobility Specialist [address]: [phone]

## 2021-11-14 NOTE — Discharge Summary (Signed)
Physician Discharge Summary  Stacy Ortega [id number] DOB: 02-10-1923 DOA: 11/13/2021  PCP: Leonard Downing, MD  Admit date: 11/13/2021 Discharge date: 11/14/2021  Admitted From: Home Discharge disposition: Home  Recommendations at discharge:  Metoprolol succinate dose has been increased from 12.5mg  to 25 mg daily. At your request, Eliquis has been switched back to aspirin 325 mg daily. Follow-up with cardiology as an outpatient.  History of Present Illness / Brief narrative:  Stacy Ortega is a 88 y.o. female with history of hypertension, CHF, glaucoma, cataract.   Patient presented to the ED from home today for evaluation of shortness of breath, left-sided chest pain.  Per report, patient became short of breath last night while in bed.  She also had chest pain radiating up to her jaw. She reports chronic shortness of breath but last night and orthopnea was more intense.   In the ED, patient had heart rate elevated up to 161, blood pressure elevated to 164/116, breathing on room air with O2 sat at 95% Labs showed sodium at 134, potassium 3.4, BNP elevated to 274, WBC count slightly elevated to 13. Chest x-ray showed small bilateral pleural effusions, left greater than right, with associated bibasilar opacities.   Patient was given a dose of IV Lasix 20 mg and started on Cardizem drip.   At the time of my evaluation, patient was propped up in bed.  Alert, awake, oriented x3.  Little hard of hearing.  Daughter was at bedside and helped with the history. Patient had not seen a physician for several years.  She was taking lisinopril and aspirin 325 mg daily. Patient was recently seen by her primary care provider and was noted to be in A-fib.  She was also noted to have pedal edema.  She was switched to metoprolol succinate 12.5 mg daily, Eliquis 2.5 twice daily and Lasix 20 mg daily.  Patient does not like these medications but she was compliant to them anyway.   Subjective:  Seen  and examined this morning.  Pleasant elderly Caucasian female.  Lying on bed.  Was on Cardizem drip which was gradually weaned down. Patient strongly wants to go home  Hospital Course:  A-fib with RVR -Started having chest pain, shortness of breath the night before presentation.  Probably it was the time of onset of RVR. -PTA on metoprolol succinate 12.5 mg daily and Eliquis 2.5 L twice daily -In the ED, she was started on Cardizem drip.  It was maintained overnight and gradually weaned off this morning.  Metoprolol dose was increased to Lopressor 12.5 mg twice daily.  Heart rate controlled on the same.  Discharge home on metoprolol succinate 25 mg daily -Previously patient was on aspirin 325 mg daily.  Lately she was started on Eliquis for anticoagulation.  Patient does not want to continue Eliquis and wants to go back to aspirin.  I think it is is reasonable to switch from Eliquis to aspirin considering her advanced age, risk of fall. -Echocardiogram pending at this time. -Patient has a follow-up plan with cardiology.   Essential hypertension -Blood pressure stable on metoprolol.  Prior to admission, she was also on Lasix 20 g daily.  Currently euvolemic.  I will resume Lasix only as as needed because of the risk of dehydration at her age   Goals of care - -  Code Status: DNR   Nutritional status:  Body mass index is 24.24 kg/m.      Diet:  Diet Order  Diet general           Diet Heart Room service appropriate? Yes; Fluid consistency: Thin  Diet effective now                    Wounds:  -    Discharge Exam:   Vitals:   11/13/21 2300 11/14/21 0435 11/14/21 0800 11/14/21 0930  BP: 102/66 118/65  128/73  Pulse: 86 96 96   Resp: 19 20 (!) 23   Temp: 98.6 F (37 C) 97.8 F (36.6 C)  98 F (36.7 C)  TempSrc: Oral Oral  Oral  SpO2: 97% 97% 100%   Weight:      Height:        Body mass index is 24.24 kg/m.  General exam: Pleasant, elderly Caucasian  female.  Not in physical distress Skin: No rashes, lesions or ulcers. HEENT: Atraumatic, normocephalic, no obvious bleeding Lungs: Clear to auscultation bilaterally CVS: Rate controlled A-fib, no murmur GI/Abd soft, nontender, nondistended, bowel sound present CNS: Alert, awake, oriented x3 Psychiatry: Mood appropriate Extremities: No pedal edema, no calf tenderness  Follow ups:    Follow-up Information     Leonard Downing, MD Follow up.   Specialty: Family Medicine Contact information: [address] [place] [address] [phone]                 Discharge Instructions:   Discharge Instructions     Amb referral to [facility]   Complete by: As directed    Call MD for:  difficulty breathing, headache or visual disturbances   Complete by: As directed    Call MD for:  extreme fatigue   Complete by: As directed    Call MD for:  hives   Complete by: As directed    Call MD for:  persistant dizziness or light-headedness   Complete by: As directed    Call MD for:  persistant nausea and vomiting   Complete by: As directed    Call MD for:  severe uncontrolled pain   Complete by: As directed    Call MD for:  temperature >100.4   Complete by: As directed    Diet general   Complete by: As directed    Discharge instructions   Complete by: As directed    Recommendations at discharge:   Metoprolol succinate dose has been increased from 12.5mg  to 25 mg daily.  At your request, Eliquis has been switched back to aspirin 325 mg daily.  Follow-up with cardiology as an outpatient.  General discharge instructions: Follow with Primary MD Leonard Downing, MD in 7 days  Please request your PCP  to go over your hospital tests, procedures, radiology results at the follow up. Please get your medicines reviewed and adjusted.  Your PCP may decide to repeat certain labs or tests as needed. Do not drive, operate heavy machinery, perform activities at heights,  swimming or participation in water activities or provide baby sitting services if your were admitted for syncope or siezures until you have seen by Primary MD or a Neurologist and advised to do so again. [place] Controlled Substance Reporting System database was reviewed. Do not drive, operate heavy machinery, perform activities at heights, swim, participate in water activities or provide baby-sitting services while on medications for pain, sleep and mood until your outpatient physician has reevaluated you and advised to do so again.  You are strongly recommended to comply with the dose, frequency and duration of prescribed  medications. Activity: As tolerated with Full fall precautions use walker/cane & assistance as needed Avoid using any recreational substances like cigarette, tobacco, alcohol, or non-prescribed drug. If you experience worsening of your admission symptoms, develop shortness of breath, life threatening emergency, suicidal or homicidal thoughts you must seek medical attention immediately by calling 911 or calling your MD immediately  if symptoms less severe. You must read complete instructions/literature along with all the possible adverse reactions/side effects for all the medicines you take and that have been prescribed to you. Take any new medicine only after you have completely understood and accepted all the possible adverse reactions/side effects.  Wear Seat belts while driving. You were cared for by a hospitalist during your hospital stay. If you have any questions about your discharge medications or the care you received while you were in the hospital after you are discharged, you can call the unit and ask to speak with the hospitalist or the covering physician. Once you are discharged, your primary care physician will handle any further medical issues. Please note that NO REFILLS for any discharge medications will be authorized once you are discharged, as it is imperative that  you return to your primary care physician (or establish a relationship with a primary care physician if you do not have one).   Increase activity slowly   Complete by: As directed        Discharge Medications:   Allergies as of 11/14/2021       Reactions   Amoxicillin Anaphylaxis   Codeine Nausea And Vomiting   Morphine And Related Nausea And Vomiting        Medication List     STOP taking these medications    Eliquis 2.5 MG Tabs tablet Generic drug: apixaban       TAKE these medications    aspirin EC 325 MG tablet Take 1 tablet (325 mg total) by mouth daily.   furosemide 20 MG tablet Commonly known as: LASIX Take 1 tablet (20 mg total) by mouth daily as needed for fluid or edema. What changed:  when to take this reasons to take this   latanoprost 0.005 % ophthalmic solution Commonly known as: XALATAN Place 1 drop into both eyes at bedtime.   metoprolol succinate 25 MG 24 hr tablet Commonly known as: TOPROL-XL Take 1 tablet (25 mg total) by mouth daily. What changed: how much to take   pilocarpine 2 % ophthalmic solution Commonly known as: PILOCAR Place 1 drop into both eyes 4 (four) times daily.         The results of significant diagnostics from this hospitalization (including imaging, microbiology, ancillary and laboratory) are listed below for reference.    Procedures and Diagnostic Studies:   DG Chest 2 View  Result Date: 11/13/2021 CLINICAL DATA:  Shortness of breath EXAM: CHEST - 2 VIEW COMPARISON:  01/04/2019 FINDINGS: Cardiomegaly. Small bilateral pleural effusions, left greater than right, with associated bibasilar opacities. No pneumothorax. IMPRESSION: Small bilateral pleural effusions, left greater than right, with associated bibasilar opacities. Electronically Signed   By: Duanne Guess D.O.   On: 11/13/2021 14:30     Labs:   Basic Metabolic Panel: Recent Labs  Lab 11/13/21 1403 11/13/21 1439 11/14/21 0133  NA 134*  --   133*  K 3.4*  --  3.4*  CL 94*  --  96*  CO2 28  --  31  GLUCOSE 136*  --  136*  BUN 13  --  11  CREATININE 0.78  --  0.83  CALCIUM 9.0  --  8.9  MG  --  2.1  --    GFR Estimated Creatinine Clearance: 26.6 mL/min (by C-G formula based on SCr of 0.83 mg/dL). Liver Function Tests: No results for input(s): "AST", "ALT", "ALKPHOS", "BILITOT", "PROT", "ALBUMIN" in the last 168 hours. No results for input(s): "LIPASE", "AMYLASE" in the last 168 hours. No results for input(s): "AMMONIA" in the last 168 hours. Coagulation profile No results for input(s): "INR", "PROTIME" in the last 168 hours.  CBC: Recent Labs  Lab 11/13/21 1403 11/14/21 0133  WBC 13.0* 10.0  HGB 15.0 13.4  HCT 43.8 39.4  MCV 89.8 89.7  PLT 309 281   Cardiac Enzymes: No results for input(s): "CKTOTAL", "CKMB", "CKMBINDEX", "TROPONINI" in the last 168 hours. BNP: Invalid input(s): "POCBNP" CBG: No results for input(s): "GLUCAP" in the last 168 hours. D-Dimer No results for input(s): "DDIMER" in the last 72 hours. Hgb A1c No results for input(s): "HGBA1C" in the last 72 hours. Lipid Profile No results for input(s): "CHOL", "HDL", "LDLCALC", "TRIG", "CHOLHDL", "LDLDIRECT" in the last 72 hours. Thyroid function studies Recent Labs    11/13/21 1535  TSH 0.704   Anemia work up No results for input(s): "VITAMINB12", "FOLATE", "FERRITIN", "TIBC", "IRON", "RETICCTPCT" in the last 72 hours. Microbiology No results found for this or any previous visit (from the past 240 hour(s)).  Time coordinating discharge: 35 minutes  Signed: Burt Piatek  [facility] 11/14/2021, 1:13 PM

## 2021-11-15 ENCOUNTER — Encounter: Payer: Self-pay | Admitting: Cardiology

## 2021-11-15 ENCOUNTER — Ambulatory Visit: Payer: Medicare PPO | Admitting: Cardiology

## 2021-11-15 VITALS — BP 160/90 | HR 57 | Ht <= 58 in | Wt 117.4 lb

## 2021-11-15 DIAGNOSIS — I4891 Unspecified atrial fibrillation: Secondary | ICD-10-CM

## 2021-11-15 MED ORDER — DILTIAZEM HCL ER COATED BEADS 180 MG PO CP24: 180.0000 mg | ORAL_CAPSULE | Freq: Every day | ORAL | 6 refills | Status: DC

## 2021-11-15 NOTE — Progress Notes (Signed)
Cardiology Office Note:    Date:  11/15/2021   ID:  Stacy Ortega, DOB 1922/12/18, MRN [id number]  PCP:  Kaleen Mask, MD   [facility] Providers Cardiologist:  None     Referring MD: Kaleen Mask, *    History of Present Illness:    Stacy Ortega is a 88 y.o. female here for the evaluation of atrial fibrillation at the request of Dr. Jeannetta Nap.  She was recently hospitalized and discharged yesterday on 11/14/2021.  She has a history of hypertension diastolic heart failure.  She came into the emergency department on the 26 with left-sided chest pain and shortness of breath.  She also had pain radiating to her jaw.  Blood pressure was elevated in the emergency with heart rate of 161 bpm and atrial fibrillation department BNP was slightly elevated at 274 with chest x-ray showing small bilateral pleural effusions left greater than right.  She was given IV Lasix 20 mg and started on a Cardizem drip.  On the floor she was switched to metoprolol tartrate 12.5 mg twice daily and then finally discharged on metoprolol succinate 25 mg a day.  Eliquis 2.5 mg a day and Lasix 20 mg a day.  Lasix was changed to 20 mg as needed because of risk of dehydration. She admitted that she did not like these medications but was compliant with them.  She stopped taking the Eliquis 2.5 mg twice a day.  She did not like the way it made her feel.  She has been taking aspirin since the 1940s.  Echocardiogram showed EF of 40 to 45%.  DNR status  She did have some shortness of breath earlier this morning.  May have been from A-fib RVR.  Was on lisinopril prior. Retired Engineer, [employer]. [place], instruction   Past Medical History:  Diagnosis Date   A-fib [facility])    Cataract    Dizzy    Fluid retention    Glaucoma    Hypertension    Lightheaded    SOB (shortness of breath)     No past surgical history on file.  Current Medications: Current Meds  Medication Sig   aspirin EC 325 MG tablet  Take 1 tablet (325 mg total) by mouth daily.   diltiazem (CARDIZEM CD) 180 MG 24 hr capsule Take 1 capsule (180 mg total) by mouth daily.   furosemide (LASIX) 20 MG tablet Take 1 tablet (20 mg total) by mouth daily as needed for fluid or edema.   latanoprost (XALATAN) 0.005 % ophthalmic solution Place 1 drop into both eyes at bedtime.   pilocarpine (PILOCAR) 2 % ophthalmic solution Place 1 drop into both eyes 4 (four) times daily.   [DISCONTINUED] metoprolol succinate (TOPROL-XL) 25 MG 24 hr tablet Take 1 tablet (25 mg total) by mouth daily.     Allergies:   Amoxicillin, Codeine, and Morphine and related   Social History   Socioeconomic History   Marital status: Married    Spouse name: Not on file   Number of children: Not on file   Years of education: Not on file   Highest education level: Not on file  Occupational History   Not on file  Tobacco Use   Smoking status: Never   Smokeless tobacco: Never  Substance and Sexual Activity   Alcohol use: Not Currently   Drug use: Not Currently   Sexual activity: Not on file  Other Topics Concern   Not on file  Social History Narrative  Not on file   Social Determinants of Health   Financial Resource Strain: Not on file  Food Insecurity: Not on file  Transportation Needs: Not on file  Physical Activity: Not on file  Stress: Not on file  Social Connections: Not on file     Family History: The patient's family history is not on file.  ROS:   Please see the history of present illness.     All other systems reviewed and are negative.  EKGs/Labs/Other Studies Reviewed:    The following studies were reviewed today: ECHO 11/14/21:   1. Left ventricular ejection fraction, by estimation, is 40 to 45%. The  left ventricle has mildly decreased function. The left ventricle  demonstrates global hypokinesis. Left ventricular diastolic parameters are  indeterminate.   2. Right ventricular systolic function is mildly reduced. The  right  ventricular size is mildly enlarged. There is mildly elevated pulmonary  artery systolic pressure. The estimated right ventricular systolic  pressure is 123456 mmHg.   3. Left atrial size was moderately dilated.   4. Right atrial size was moderately dilated.   5. The mitral valve is abnormal. Moderate mitral valve regurgitation. No  evidence of mitral stenosis.   6. The tricuspid valve is abnormal. Tricuspid valve regurgitation is  moderate.   7. The aortic valve is tricuspid. Aortic valve regurgitation is trivial.  No aortic stenosis is present.   8. The inferior vena cava is dilated in size with <50% respiratory  variability, suggesting right atrial pressure of 15 mmHg.   9. The patient was in atrial fibrillation.   EKG:  EKG from 11/13/2021 shows atrial fibrillation heart rate 149 bpm  Recent Labs: 11/13/2021: B Natriuretic Peptide 273.9; Magnesium 2.1; TSH 0.704 11/14/2021: BUN 11; Creatinine, Ser 0.83; Hemoglobin 13.4; Platelets 281; Potassium 3.4; Sodium 133  Recent Lipid Panel No results found for: "CHOL", "TRIG", "HDL", "CHOLHDL", "VLDL", "LDLCALC", "LDLDIRECT"   Risk Assessment/Calculations:              Physical Exam:    VS:  BP (!) 160/90   Pulse (!) 57   Ht 4\' 9"  (1.448 m)   Wt 117 lb 6.4 oz (53.3 kg)   SpO2 95%   BMI 25.41 kg/m     Wt Readings from Last 3 Encounters:  11/15/21 117 lb 6.4 oz (53.3 kg)  11/13/21 112 lb (50.8 kg)     GEN:  Well nourished, well developed in no acute distress HEENT: Normal NECK: No JVD; No carotid bruits LYMPHATICS: No lymphadenopathy CARDIAC: Irregularly irregular, tachycardic, no murmurs, no rubs, gallops RESPIRATORY:  Clear to auscultation without rales, wheezing or rhonchi  ABDOMEN: Soft, non-tender, non-distended MUSCULOSKELETAL:  No edema; No deformity  SKIN: Warm and dry NEUROLOGIC:  Alert and oriented x 3 PSYCHIATRIC:  Normal affect   ASSESSMENT:    No diagnosis found. PLAN:    In order of problems  listed above:  Persistent atrial fibrillation - I am aware that her echocardiogram showed EF of 40 to 45%.  However the diltiazem drip at 7.5 mg/h helped regulate her heart better than anything.  She was down into the 80s and 70s in the hospital with this.  Because of this, I will go ahead and transition her over to diltiazem CD1 180 mg once a day.  Watch for any signs of bradycardia.  We will discontinue her Toprol-XL 25 mg.  Her heart rate today is approximately 140-150. -We discussed at length chronic anticoagulation.  She does not desire to be  on Eliquis.  She understands the risk of stroke.  Her daughter is understanding as well.  She states that she felt terrible on this medication.  She will continue with her aspirin.  She has sustained a fall in the past.  Nonischemic cardiomyopathy - It is likely that she has a tachycardia induced cardiomyopathy from atrial fibrillation.  Lets try the diltiazem to slow her down.  She had some shortness of breath earlier this morning likely because of this.  Continue with Lasix 20 mg once a day.  ER notes reviewed hospitalization notes reviewed.  Lab work reviewed.  Ultimately, trying to keep her out of the hospital again.  We will see if we can help rate control her atrial fibrillation in the outpatient setting.     Medication Adjustments/Labs and Tests Ordered: Current medicines are reviewed at length with the patient today.  Concerns regarding medicines are outlined above.  No orders of the defined types were placed in this encounter.  Meds ordered this encounter  Medications   diltiazem (CARDIZEM CD) 180 MG 24 hr capsule    Sig: Take 1 capsule (180 mg total) by mouth daily.    Dispense:  90 capsule    Refill:  6    Patient Instructions  Medication Instructions:  Please discontinue your Metoprolol. Start Diltiazem CD 180 mg daily. Continue all other medications as listed.  *If you need a refill on your cardiac medications before your next  appointment, please call your pharmacy*   Follow-Up: At [facility], you and your health needs are our priority.  As part of our continuing mission to provide you with exceptional heart care, we have created designated Provider Care Teams.  These Care Teams include your primary Cardiologist (physician) and Advanced Practice Providers (APPs -  Physician Assistants and Nurse Practitioners) who all work together to provide you with the care you need, when you need it.  We recommend signing up for the patient portal called "MyChart".  Sign up information is provided on this After Visit Summary.  MyChart is used to connect with patients for Virtual Visits (Telemedicine).  Patients are able to view lab/test results, encounter notes, upcoming appointments, etc.  Non-urgent messages can be sent to your provider as well.   To learn more about what you can do with MyChart, go to [email or web address].    Your next appointment:   See Dr Anne Fu back on Monday.  Important Information About Sugar         Signed, Donato Schultz, MD  11/15/2021 2:31 PM    [facility]

## 2021-11-15 NOTE — Patient Instructions (Signed)
Medication Instructions:  Please discontinue your Metoprolol. Start Diltiazem CD 180 mg daily. Continue all other medications as listed.  *If you need a refill on your cardiac medications before your next appointment, please call your pharmacy*   Follow-Up: At [facility], you and your health needs are our priority.  As part of our continuing mission to provide you with exceptional heart care, we have created designated Provider Care Teams.  These Care Teams include your primary Cardiologist (physician) and Advanced Practice Providers (APPs -  Physician Assistants and Nurse Practitioners) who all work together to provide you with the care you need, when you need it.  We recommend signing up for the patient portal called "MyChart".  Sign up information is provided on this After Visit Summary.  MyChart is used to connect with patients for Virtual Visits (Telemedicine).  Patients are able to view lab/test results, encounter notes, upcoming appointments, etc.  Non-urgent messages can be sent to your provider as well.   To learn more about what you can do with MyChart, go to [email or web address].    Your next appointment:   See Dr Anne Fu back on Monday.  Important Information About Sugar

## 2021-11-18 ENCOUNTER — Encounter: Payer: Self-pay | Admitting: Cardiology

## 2021-11-18 ENCOUNTER — Ambulatory Visit: Payer: Medicare PPO | Admitting: Cardiology

## 2021-11-18 ENCOUNTER — Telehealth: Payer: Self-pay | Admitting: Cardiology

## 2021-11-18 VITALS — BP 180/90 | HR 118 | Ht <= 58 in | Wt 118.2 lb

## 2021-11-18 DIAGNOSIS — I428 Other cardiomyopathies: Secondary | ICD-10-CM | POA: Diagnosis not present

## 2021-11-18 DIAGNOSIS — I4891 Unspecified atrial fibrillation: Secondary | ICD-10-CM | POA: Diagnosis not present

## 2021-11-18 MED ORDER — DILTIAZEM HCL ER COATED BEADS 180 MG PO CP24: 180.0000 mg | ORAL_CAPSULE | Freq: Every day | ORAL | 6 refills | Status: DC

## 2021-11-18 NOTE — Progress Notes (Signed)
e

## 2021-11-18 NOTE — Patient Instructions (Signed)
Medication Instructions:  Please increase Diltiazem to 180 mg in the AM and 180 mg in the PM.  *If you need a refill on your cardiac medications before your next appointment, please call your pharmacy*  Follow-Up: At [facility], you and your health needs are our priority.  As part of our continuing mission to provide you with exceptional heart care, we have created designated Provider Care Teams.  These Care Teams include your primary Cardiologist (physician) and Advanced Practice Providers (APPs -  Physician Assistants and Nurse Practitioners) who all work together to provide you with the care you need, when you need it.  We recommend signing up for the patient portal called "MyChart".  Sign up information is provided on this After Visit Summary.  MyChart is used to connect with patients for Virtual Visits (Telemedicine).  Patients are able to view lab/test results, encounter notes, upcoming appointments, etc.  Non-urgent messages can be sent to your provider as well.   To learn more about what you can do with MyChart, go to [email or web address].    Your next appointment:   Friday August 4 at 10 AM.   Important Information About Sugar

## 2021-11-18 NOTE — Telephone Encounter (Signed)
New Message:    She needs to verify the directions for patient's Diltiazem. She have 2 different directions.

## 2021-11-18 NOTE — Telephone Encounter (Signed)
Spoke with pharmacy.  Advised pt was instructed to increase Diltiazem to 180 mg BID today at her appt.  Pt does not need a new RX at this time as she just had it filled on Friday.  Pt will also be seen again this Friday and medication dose may change again.  Pharmacist states understanding.

## 2021-11-18 NOTE — Progress Notes (Signed)
Cardiology Office Note:    Date:  11/18/2021   ID:  Stacy Ortega, DOB 17-Aug-1922, MRN [id number]  PCP:  Kaleen Mask, MD   [facility] Providers Cardiologist:  None     Referring MD: Kaleen Mask, *    History of Present Illness:    Stacy Ortega is a 88 y.o. female here for follow-up persistent atrial fibrillation with rapid ventricular response.  At visit 3 days ago we started her on diltiazem 180 mg long-acting daily.  Heart rate was in the 150s, currently 118.  Improved.  She is feeling less short of breath.  She was able to walk the driveway.  Still not optimal heart rate wise.  She was hospitalized on 11/14/2021 with atrial fibrillation and diastolic heart failure.  Did not like taking Eliquis.  She stopped it.  Did not like the way it made her feel.  Echo EF 40 to 45%.  She states that while getting the echocardiogram it was quite painful with probe.  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  Her daughter lives in [place].  Her daughter would like for her to stay with her a couple days a week.    Past Medical History:  Diagnosis Date   A-fib [facility])    Cataract    Dizzy    Fluid retention    Glaucoma    Hypertension    Lightheaded    SOB (shortness of breath)     No past surgical history on file.  Current Medications: Current Meds  Medication Sig   aspirin EC 325 MG tablet Take 1 tablet (325 mg total) by mouth daily.   furosemide (LASIX) 20 MG tablet Take 1 tablet (20 mg total) by mouth daily as needed for fluid or edema.   latanoprost (XALATAN) 0.005 % ophthalmic solution Place 1 drop into both eyes at bedtime.   pilocarpine (PILOCAR) 2 % ophthalmic solution Place 1 drop into both eyes 4 (four) times daily.   [DISCONTINUED] diltiazem (CARDIZEM CD) 180 MG 24 hr capsule Take 1 capsule (180 mg total) by mouth daily.     Allergies:   Amoxicillin, Codeine, and Morphine and related   Social History    Socioeconomic History   Marital status: Married    Spouse name: Not on file   Number of children: Not on file   Years of education: Not on file   Highest education level: Not on file  Occupational History   Not on file  Tobacco Use   Smoking status: Never   Smokeless tobacco: Never  Substance and Sexual Activity   Alcohol use: Not Currently   Drug use: Not Currently   Sexual activity: Not on file  Other Topics Concern   Not on file  Social History Narrative   Not on file   Social Determinants of Health   Financial Resource Strain: Not on file  Food Insecurity: Not on file  Transportation Needs: Not on file  Physical Activity: Not on file  Stress: Not on file  Social Connections: Not on file     Family History: The patient's family history is not on file.  ROS:   Please see the history of present illness.     All other systems reviewed and are negative.  EKGs/Labs/Other Studies Reviewed:    The following studies were reviewed today: ECHO 11/14/21:   1. Left ventricular ejection fraction, by estimation, is 40 to 45%. The  left ventricle has mildly decreased  function. The left ventricle  demonstrates global hypokinesis. Left ventricular diastolic parameters are  indeterminate.   2. Right ventricular systolic function is mildly reduced. The right  ventricular size is mildly enlarged. There is mildly elevated pulmonary  artery systolic pressure. The estimated right ventricular systolic  pressure is 41.4 mmHg.   3. Left atrial size was moderately dilated.   4. Right atrial size was moderately dilated.   5. The mitral valve is abnormal. Moderate mitral valve regurgitation. No  evidence of mitral stenosis.   6. The tricuspid valve is abnormal. Tricuspid valve regurgitation is  moderate.   7. The aortic valve is tricuspid. Aortic valve regurgitation is trivial.  No aortic stenosis is present.   8. The inferior vena cava is dilated in size with <50% respiratory   variability, suggesting right atrial pressure of 15 mmHg.   9. The patient was in atrial fibrillation.     EKG:  EKG is  ordered today.  The ekg ordered today demonstrates atrial fibrillation 118.  Prior on 7/26 atrial fibrillation 149  Recent Labs: 11/13/2021: B Natriuretic Peptide 273.9; Magnesium 2.1; TSH 0.704 11/14/2021: BUN 11; Creatinine, Ser 0.83; Hemoglobin 13.4; Platelets 281; Potassium 3.4; Sodium 133  Recent Lipid Panel No results found for: "CHOL", "TRIG", "HDL", "CHOLHDL", "VLDL", "LDLCALC", "LDLDIRECT"   Risk Assessment/Calculations:              Physical Exam:    VS:  BP (!) 180/90   Pulse (!) 118   Ht 4\' 9"  (1.448 m)   Wt 118 lb 3.2 oz (53.6 kg)   SpO2 92%   BMI 25.58 kg/m     Wt Readings from Last 3 Encounters:  11/18/21 118 lb 3.2 oz (53.6 kg)  11/15/21 117 lb 6.4 oz (53.3 kg)  11/13/21 112 lb (50.8 kg)     GEN:  Well nourished, well developed in no acute distress HEENT: Normal NECK: No JVD; No carotid bruits LYMPHATICS: No lymphadenopathy CARDIAC: Irregularly irregular, no murmurs, no rubs, gallops RESPIRATORY:  Clear to auscultation without rales, wheezing or rhonchi  ABDOMEN: Soft, non-tender, non-distended MUSCULOSKELETAL:  No edema; No deformity  SKIN: Warm and dry NEUROLOGIC:  Alert and oriented x 3 PSYCHIATRIC:  Normal affect   ASSESSMENT:    1. Atrial fibrillation with RVR (HCC)   2. Nonischemic cardiomyopathy (HCC)    PLAN:    In order of problems listed above:  Persistent atrial fibrillation - Increasing diltiazem to 180 mg twice a day from once a day.  Watch for signs of bradycardia.  We will see her back on Friday.  Discussed potential for tachycardia/bradycardia syndrome.  She does feel better. -Does not desire to take anticoagulation.  Understands potential risks of stroke.  She is continuing with aspirin.  She has sustained a fall in the past.  Nonischemic cardiomyopathy - Possibly tachycardia induced.  She is taking  Lasix 20 mg once a day.  She does not desire to go back to the hospital.  This is why we are having close follow-up and trying to avoid hospitalization.   Hypertension - Blood pressure is elevated once again today.  At prior visit it was improved.  We are increasing her diltiazem.  Lets see how this goes.     Medication Adjustments/Labs and Tests Ordered: Current medicines are reviewed at length with the patient today.  Concerns regarding medicines are outlined above.  Orders Placed This Encounter  Procedures   EKG 12-Lead   Meds ordered this encounter  Medications  diltiazem (CARDIZEM CD) 180 MG 24 hr capsule    Sig: Take 1 capsule (180 mg total) by mouth daily. Take twice daily    Dispense:  90 capsule    Refill:  6    Patient Instructions  Medication Instructions:  Please increase Diltiazem to 180 mg in the AM and 180 mg in the PM.  *If you need a refill on your cardiac medications before your next appointment, please call your pharmacy*  Follow-Up: At [facility], you and your health needs are our priority.  As part of our continuing mission to provide you with exceptional heart care, we have created designated Provider Care Teams.  These Care Teams include your primary Cardiologist (physician) and Advanced Practice Providers (APPs -  Physician Assistants and Nurse Practitioners) who all work together to provide you with the care you need, when you need it.  We recommend signing up for the patient portal called "MyChart".  Sign up information is provided on this After Visit Summary.  MyChart is used to connect with patients for Virtual Visits (Telemedicine).  Patients are able to view lab/test results, encounter notes, upcoming appointments, etc.  Non-urgent messages can be sent to your provider as well.   To learn more about what you can do with MyChart, go to [email or web address].    Your next appointment:   Friday August 4 at 10 AM.   Important Information About  Sugar         Signed, Candee Furbish, MD  11/18/2021 10:39 AM    [place]

## 2021-11-18 NOTE — Telephone Encounter (Signed)
Pharmacy calling requesting clarification on medication directions for diltiazem 180 mg capsules. Please address

## 2021-11-19 ENCOUNTER — Other Ambulatory Visit (HOSPITAL_COMMUNITY): Payer: Medicare Other

## 2021-11-22 ENCOUNTER — Ambulatory Visit: Payer: Medicare PPO | Admitting: Cardiology

## 2021-11-22 ENCOUNTER — Encounter: Payer: Self-pay | Admitting: Cardiology

## 2021-11-22 VITALS — BP 142/80 | HR 116 | Ht 59.0 in | Wt 117.0 lb

## 2021-11-22 DIAGNOSIS — I428 Other cardiomyopathies: Secondary | ICD-10-CM

## 2021-11-22 DIAGNOSIS — I4891 Unspecified atrial fibrillation: Secondary | ICD-10-CM | POA: Diagnosis not present

## 2021-11-22 LAB — BASIC METABOLIC PANEL
BUN/Creatinine Ratio: 14 (ref 12–28)
BUN: 11 mg/dL (ref 10–36)
CO2: 26 mmol/L (ref 20–29)
Calcium: 9.7 mg/dL (ref 8.7–10.3)
Chloride: 93 mmol/L — ABNORMAL LOW (ref 96–106)
Creatinine, Ser: 0.78 mg/dL (ref 0.57–1.00)
Glucose: 92 mg/dL (ref 70–99)
Potassium: 3.7 mmol/L (ref 3.5–5.2)
Sodium: 135 mmol/L (ref 134–144)
eGFR: 69 mL/min/{1.73_m2} (ref >59)

## 2021-11-22 MED ORDER — DILTIAZEM HCL ER COATED BEADS 180 MG PO CP24: 180.0000 mg | ORAL_CAPSULE | Freq: Two times a day (BID) | ORAL | 3 refills | Status: DC

## 2021-11-22 NOTE — Patient Instructions (Addendum)
Medication Instructions:  Your physician has recommended you make the following change in your medication:  Take Cardizem 180 2 times a day  *If you need a refill on your cardiac medications before your next appointment, please call your pharmacy*   Lab Work: Bmet If you have labs (blood work) drawn today and your tests are completely normal, you will receive your results only by: MyChart Message (if you have MyChart) OR A paper copy in the mail If you have any lab test that is abnormal or we need to change your treatment, we will call you to review the results.   Testing/Procedures: Bmet   Follow-Up: At [facility], you and your health needs are our priority.  As part of our continuing mission to provide you with exceptional heart care, we have created designated Provider Care Teams.  These Care Teams include your primary Cardiologist (physician) and Advanced Practice Providers (APPs -  Physician Assistants and Nurse Practitioners) who all work together to provide you with the care you need, when you need it.  We recommend signing up for the patient portal called "MyChart".  Sign up information is provided on this After Visit Summary.  MyChart is used to connect with patients for Virtual Visits (Telemedicine).  Patients are able to view lab/test results, encounter notes, upcoming appointments, etc.  Non-urgent messages can be sent to your provider as well.   To learn more about what you can do with MyChart, go to [email or web address].    Your next appointment:   2 months  The format for your next appointment:   In Person  Provider:   None {

## 2021-11-22 NOTE — Progress Notes (Signed)
Cardiology Office Note:    Date:  11/24/2021   ID:  Stacy Ortega, DOB 1922-10-29, MRN [id number]  PCP:  Stacy Mask, MD   [facility] Providers Cardiologist:  None     Referring MD: Stacy Ortega, *    History of Present Illness:    Stacy Ortega is a 88 y.o. female here for AFIB follow up for rate control.  Has been seen in the hospital last week, clinic with 150 bpm,   clinic again [facility] with 119 bpm on dilt CD 180   and now comes in Fri after dilt CD 180 BID.   Did not like taking Eliquis.  She stopped it.  Did not like the way it made her feel.   Echo EF 40 to 45%.  She states that while getting the echocardiogram it was quite painful with probe.   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  Her daughter lives in [place].  Her daughter would like for her to stay with her a couple days a week. Past Medical History:  Diagnosis Date   A-fib [facility])    Cataract    Dizzy    Fluid retention    Glaucoma    Hypertension    Lightheaded    SOB (shortness of breath)     History reviewed. No pertinent surgical history.  Current Medications: Current Meds  Medication Sig   aspirin EC 325 MG tablet Take 1 tablet (325 mg total) by mouth daily.   diltiazem (CARDIZEM CD) 180 MG 24 hr capsule Take 1 capsule (180 mg total) by mouth in the morning and at bedtime.   furosemide (LASIX) 20 MG tablet Take 1 tablet (20 mg total) by mouth daily as needed for fluid or edema.   latanoprost (XALATAN) 0.005 % ophthalmic solution Place 1 drop into both eyes at bedtime.   pilocarpine (PILOCAR) 2 % ophthalmic solution Place 1 drop into both eyes 4 (four) times daily.   [DISCONTINUED] diltiazem (CARDIZEM CD) 180 MG 24 hr capsule Take 1 capsule (180 mg total) by mouth daily. Take twice daily     Allergies:   Amoxicillin, Codeine, and Morphine and related   Social History   Socioeconomic History   Marital status: Married    Spouse name: Not  on file   Number of children: Not on file   Years of education: Not on file   Highest education level: Not on file  Occupational History   Not on file  Tobacco Use   Smoking status: Never   Smokeless tobacco: Never  Substance and Sexual Activity   Alcohol use: Not Currently   Drug use: Not Currently   Sexual activity: Not on file  Other Topics Concern   Not on file  Social History Narrative   Not on file   Social Determinants of Health   Financial Resource Strain: Not on file  Food Insecurity: Not on file  Transportation Needs: Not on file  Physical Activity: Not on file  Stress: Not on file  Social Connections: Not on file     Family History: The patient's family history is not on file.  ROS:   Please see the history of present illness.     All other systems reviewed and are negative.  EKGs/Labs/Other Studies Reviewed:    The following studies were reviewed today: ECHO 11/14/21:   1. Left ventricular ejection fraction, by estimation, is 40 to 45%. The  left ventricle has mildly  decreased function. The left ventricle  demonstrates global hypokinesis. Left ventricular diastolic parameters are  indeterminate.   2. Right ventricular systolic function is mildly reduced. The right  ventricular size is mildly enlarged. There is mildly elevated pulmonary  artery systolic pressure. The estimated right ventricular systolic  pressure is 123456 mmHg.   3. Left atrial size was moderately dilated.   4. Right atrial size was moderately dilated.   5. The mitral valve is abnormal. Moderate mitral valve regurgitation. No  evidence of mitral stenosis.   6. The tricuspid valve is abnormal. Tricuspid valve regurgitation is  moderate.   7. The aortic valve is tricuspid. Aortic valve regurgitation is trivial.  No aortic stenosis is present.   8. The inferior vena cava is dilated in size with <50% respiratory  variability, suggesting right atrial pressure of 15 mmHg.   9. The patient  was in atrial fibrillation.     EKG:  EKG is  ordered today.  The ekg ordered today demonstrates atrial fibrillation heart rate 116 bpm  Recent Labs: 11/13/2021: B Natriuretic Peptide 273.9; Magnesium 2.1; TSH 0.704 11/14/2021: Hemoglobin 13.4; Platelets 281 11/22/2021: BUN 11; Creatinine, Ser 0.78; Potassium 3.7; Sodium 135  Recent Lipid Panel No results found for: "CHOL", "TRIG", "HDL", "CHOLHDL", "VLDL", "LDLCALC", "LDLDIRECT"   Risk Assessment/Calculations:    CHA2DS2-VASc Score = 5   This indicates a 7.2% annual risk of stroke. The patient's score is based upon: CHF History: 1 HTN History: 1 Diabetes History: 0 Stroke History: 0 Vascular Disease History: 0 Age Score: 2 Gender Score: 1              Physical Exam:    VS:  BP (!) 142/80   Pulse (!) 116   Ht 4\' 11"  (1.499 m)   Wt 117 lb (53.1 kg)   SpO2 94%   BMI 23.63 kg/m     Wt Readings from Last 3 Encounters:  11/22/21 117 lb (53.1 kg)  11/18/21 118 lb 3.2 oz (53.6 kg)  11/15/21 117 lb 6.4 oz (53.3 kg)     GEN:  Well nourished, well developed in no acute distress HEENT: Normal NECK: No JVD; No carotid bruits LYMPHATICS: No lymphadenopathy CARDIAC: irreg , mildly tachycardic no murmurs, no rubs, gallops RESPIRATORY:  Clear to auscultation without rales, wheezing or rhonchi  ABDOMEN: Soft, non-tender, non-distended MUSCULOSKELETAL:  No edema; No deformity  SKIN: Warm and dry NEUROLOGIC:  Alert and oriented x 3 PSYCHIATRIC:  Normal affect   ASSESSMENT:    1. Atrial fibrillation with RVR ([place])   2. Nonischemic cardiomyopathy (HCC)    PLAN:    In order of problems listed above:   Persistent atrial fibrillation - Increased diltiazem to 180 mg twice a day from once a day4 days ago (Monday).  On auscultation, there seems to be improvement in overall heart rate.  I think that we are likely achieving less than 110 bpm on average.  This is good.  Watch for signs of bradycardia.  Warning signs. She does  feel better. -Does not desire to take anticoagulation.  Understands potential risks of stroke.  She is continuing with aspirin.  She has sustained a fall in the past.   Nonischemic cardiomyopathy - Possibly tachycardia induced.  She is taking Lasix 20 mg twice a day.  Could go to once a day as long as she watches her edema and weight.   She does not desire to go back to the hospital.  This is why we are had  close follow-up and trying to avoid hospitalization.     Hypertension - Blood pressure has been elevated.  It is continuing to improve.  Stable currently.  Hypokalemia - Checking basic metabolic profile today.  If necessary, we will prescribe powdered supplement of potassium chloride.  She is unable to take the larger pills.  She is drinking coconut water and bananas.  5-month follow-up      Medication Adjustments/Labs and Tests Ordered: Current medicines are reviewed at length with the patient today.  Concerns regarding medicines are outlined above.  Orders Placed This Encounter  Procedures   Basic metabolic panel   EKG 12-Lead   Meds ordered this encounter  Medications   diltiazem (CARDIZEM CD) 180 MG 24 hr capsule    Sig: Take 1 capsule (180 mg total) by mouth in the morning and at bedtime.    Dispense:  90 capsule    Refill:  3    Patient Instructions  Medication Instructions:  Your physician has recommended you make the following change in your medication:  Take Cardizem 180 2 times a day  *If you need a refill on your cardiac medications before your next appointment, please call your pharmacy*   Lab Work: Bmet If you have labs (blood work) drawn today and your tests are completely normal, you will receive your results only by: MyChart Message (if you have MyChart) OR A paper copy in the mail If you have any lab test that is abnormal or we need to change your treatment, we will call you to review the results.   Testing/Procedures: Bmet   Follow-Up: At  [facility], you and your health needs are our priority.  As part of our continuing mission to provide you with exceptional heart care, we have created designated Provider Care Teams.  These Care Teams include your primary Cardiologist (physician) and Advanced Practice Providers (APPs -  Physician Assistants and Nurse Practitioners) who all work together to provide you with the care you need, when you need it.  We recommend signing up for the patient portal called "MyChart".  Sign up information is provided on this After Visit Summary.  MyChart is used to connect with patients for Virtual Visits (Telemedicine).  Patients are able to view lab/test results, encounter notes, upcoming appointments, etc.  Non-urgent messages can be sent to your provider as well.   To learn more about what you can do with MyChart, go to [email or web address].    Your next appointment:   2 months  The format for your next appointment:   In Person  Provider:   None {        Signed, Donato Schultz, MD  11/24/2021 8:27 AM    [facility]

## 2021-11-25 ENCOUNTER — Telehealth: Payer: Self-pay

## 2021-11-25 NOTE — Telephone Encounter (Signed)
Spoke with patient's daughter Amy to give lab results and discuss options for potassium powder prescription. Amy states she would like to discuss with her sister who is a Engineer, civil (consulting) before deciding. She will call back if they decide to use the potassium powder.   No other questions or concerns at this time.

## 2021-11-25 NOTE — Telephone Encounter (Signed)
-----   Message from Jake Bathe, MD sent at 11/24/2021  8:26 AM EDT ----- Potassium is 3.7 OK to continue what you are doing now or we could give Potassium 10 meq packet (powder) daily. She could not take large pill.   Donato Schultz, MD

## 2021-12-12 ENCOUNTER — Telehealth: Payer: Self-pay | Admitting: Cardiology

## 2021-12-12 NOTE — Telephone Encounter (Signed)
Pt c/o Shortness Of Breath: STAT if SOB developed within the last 24 hours or pt is noticeably SOB on the phone  1. Are you currently SOB (can you hear that pt is SOB on the phone)? No   2. How long have you been experiencing SOB? Has became more frequent over last couple of days   3. Are you SOB when sitting or when up moving around? Moving around   4. Are you currently experiencing any other symptoms? No   Patient took two diltiazem today, is wanting to make sure this is okay. Patient is also taking two tablets daily of Lasix to help with SOB. Patient reports it helps with the fluid in her chest.    Pt c/o medication issue:  1. Name of Medication: furosemide (LASIX) 20 MG tablet  2. How are you currently taking this medication (dosage and times per day)? Two tablets daily   3. Are you having a reaction (difficulty breathing--STAT)? No   4. What is your medication issue? Patient is taking two tablets daily of lasix for her SOB. Daughter is requesting prescription be increased to two tablets as needed and a refill be sent in for the medication. Please advise.

## 2021-12-12 NOTE — Telephone Encounter (Signed)
Attempted phone call to pt's daughter to advise of Dr Anne Fu recommendation below.  Left voicemail message to contact triage at [phone]. _____________________________________________________________________________________ Let's try increasing diltiazem CD from 180 to 240 mg twice a day.  Donato Schultz, MD

## 2021-12-12 NOTE — Telephone Encounter (Signed)
Spoke with pt's daughter, Bonita Quin, [place] who states pt has had a couple of days of increased SOB.  Pt has history of Persistent Afib and Afib RVR.  Pt last seen by Dr Anne Fu on 11/22/2021.  She is currently taking Lasix 20mg  - 2 tablets by mouth daily (which she had increased on her own prior to her OV with Dr on 11/22/2021) and Diltiazem 180mg  - 1 tablet by mouth twice daily.  Pt's daughter states pt's HR this am was about 120 and pt went ahead and took 2 Cardizem 180mg  tablets.  She states pt was feeling well enough this morning to go have go have her hair permed.   Pt's current weight stable at 115lbs.  No new LEE.  Abdominal swelling has improved with Lasix 40mg  daily.  Pt does not add salt but is not following a low sodium diet.  She has been eating biscuits with bacon and cheese, spaghetti and macaroni and cheese.  Provided education on low sodium prepared foods.  Encouraged not to adjust medication dosages without speaking with physician first.  Reviewed ED precautions.  Advised will forward to Dr 01/22/2022, who is out of the office this week but covering provider can review.  Pt's daughter verbalizes understanding and agrees with current plan.

## 2021-12-12 NOTE — Telephone Encounter (Signed)
Spoke with pt's daughter, Bonita Quin, [place] and advised per Dr Anne Fu increase Diltiazem to 240mg  - 1 tablet by mouth twice daily.  Pt's daughter states pt was seen today by Dr . PCP who also recommended pt increase Diltiazem be increased to 240mg  twice daily.  She states this medication has been sent to pharmacy and thanked RN for the call.

## 2021-12-27 ENCOUNTER — Telehealth: Payer: Self-pay | Admitting: Cardiology

## 2021-12-27 NOTE — Telephone Encounter (Signed)
Spoke with daughter Amy who reports pt had been doing well but last weekend she became very depressed and didn't want to take her diltiazem.  The family let her hold 1 dose and checked her heart rate the next day which was 143 bpm.  After that pt decided to take 120 mg  a day.  Daughter reports pt has been sleeping a lot and just generally "feels bad."  Dr Jeannetta Nap ordered pt to have home 02 however it has not been delivered yet.  Pt has also been having SOB.  She has been taking Lasix 40 mg a day but yesterday and the day before she took 80 mg.  Unsure if this has helped her SOB.  Pt went to bed at 8 pm last night - slept through the night until 6 am.  Got up today, eat something and went back to bed - sleeping a lot.  Pt c/o feeling lightheaded after getting up from nap.  Attempted to check HR with sat monitor but it would not pick up on pt (it did on someone else).  Pt has not taken Diltiazem today.  Advised pt to take Diltiazem now as her heart rate maybe too fast and therefore the reason the sat monitor is not picking up heart rate.  They have not checked pts blood pressure.  No cuff at home.  Advised these can be purchased at any pharmacy, [facility] etc. And that it would be a good idea for them to have one. Advised daughter to try to make sure pt stays hydrated and takes medications as ordered.  Added pt onto Dr Anne Fu schedule for Monday.   They will call back before if questions/concerns.

## 2021-12-27 NOTE — Telephone Encounter (Signed)
Pt c/o Shortness Of Breath: STAT if SOB developed within the last 24 hours or pt is noticeably SOB on the phone  1. Are you currently SOB (can you hear that pt is SOB on the phone)?  Not currently with the patient  2. How long have you been experiencing SOB?  About 1 week  3. Are you SOB when sitting or when up moving around?  When laying down mainly, relieved by sitting up  4. Are you currently experiencing any other symptoms?  Not currently with patient, but she has had a loss of appetite   9/07 at PCP: BMP at 140, other labs fine Daughter assumes Diltiazem may be causing the SOB

## 2021-12-30 ENCOUNTER — Ambulatory Visit: Payer: Medicare PPO | Attending: Cardiology | Admitting: Cardiology

## 2021-12-30 ENCOUNTER — Encounter: Payer: Self-pay | Admitting: Cardiology

## 2021-12-30 ENCOUNTER — Telehealth: Payer: Self-pay | Admitting: Cardiology

## 2021-12-30 VITALS — BP 120/80 | HR 70 | Ht 59.0 in | Wt 121.0 lb

## 2021-12-30 DIAGNOSIS — I4891 Unspecified atrial fibrillation: Secondary | ICD-10-CM | POA: Diagnosis not present

## 2021-12-30 DIAGNOSIS — Z79899 Other long term (current) drug therapy: Secondary | ICD-10-CM | POA: Diagnosis not present

## 2021-12-30 DIAGNOSIS — I428 Other cardiomyopathies: Secondary | ICD-10-CM | POA: Diagnosis not present

## 2021-12-30 LAB — CBC
Hematocrit: 41.5 % (ref 34.0–46.6)
Hemoglobin: 14.5 g/dL (ref 11.1–15.9)
MCH: 29.8 pg (ref 26.6–33.0)
MCHC: 34.9 g/dL (ref 31.5–35.7)
MCV: 85 fL (ref 79–97)
Platelets: 483 10*3/uL — ABNORMAL HIGH (ref 150–450)
RBC: 4.87 x10E6/uL (ref 3.77–5.28)
RDW: 15.3 % (ref 11.7–15.4)
WBC: 13.2 10*3/uL — ABNORMAL HIGH (ref 3.4–10.8)

## 2021-12-30 LAB — BASIC METABOLIC PANEL
BUN/Creatinine Ratio: 20 (ref 12–28)
BUN: 14 mg/dL (ref 10–36)
CO2: 33 mmol/L — ABNORMAL HIGH (ref 20–29)
Calcium: 9.9 mg/dL (ref 8.7–10.3)
Chloride: 81 mmol/L — ABNORMAL LOW (ref 96–106)
Creatinine, Ser: 0.69 mg/dL (ref 0.57–1.00)
Glucose: 119 mg/dL — ABNORMAL HIGH (ref 70–99)
Potassium: 4.4 mmol/L (ref 3.5–5.2)
Sodium: 124 mmol/L — ABNORMAL LOW (ref 134–144)
eGFR: 79 mL/min/{1.73_m2} (ref >59)

## 2021-12-30 MED ORDER — METOPROLOL SUCCINATE ER 100 MG PO TB24: 100.0000 mg | ORAL_TABLET | Freq: Every day | ORAL | 3 refills | Status: AC

## 2021-12-30 NOTE — Telephone Encounter (Signed)
Clarified orders with Neysa Bonito through Dr Anne Fu who verbally ordered palliative care.  Neysa Bonito states understanding and will be contacting the pt/pt family.

## 2021-12-30 NOTE — Progress Notes (Signed)
Cardiology Office Note:    Date:  12/30/2021   ID:  Stacy Ortega, DOB 1922-07-22, MRN [id number]  PCP:  Leonard Downing, MD   [facility] Providers Cardiologist:  Candee Furbish, MD     Referring MD: Leonard Downing, *    History of Present Illness:    Stacy Ortega is a 88 y.o. female here for AFIB follow up for rate control.  She was seen by EMS at her house yesterday with heart rate of 134 bpm.  Very challenging to control.  They have been decreasing her diltiazem.  She does not wish to take.  She is not on anticoagulation.  Understands potential stroke risk.  She states I am 89 and I am ready to go.  I did discuss with him today potential hospitalization to help regulate the heartbeat more effectively in a more timely manner.  She does not want to go.  We discussed having palliative care team come to their house to see if there are any resources to offer her.  I think this makes sense.  Her daughter also asked about anxiety medication.  I would like Dr. Arelia Sneddon to help [place] with that.  Echo EF 40 to 45%.  She states that while getting the echocardiogram it was quite painful with probe.   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  Her daughter lives in [place].     Past Medical History:  Diagnosis Date   A-fib [facility]    Cataract    Dizzy    Fluid retention    Glaucoma    Hypertension    Lightheaded    SOB (shortness of breath)     No past surgical history on file.  Current Medications: Current Meds  Medication Sig   aspirin EC 325 MG tablet Take 1 tablet (325 mg total) by mouth daily.   furosemide (LASIX) 20 MG tablet Take 1 tablet (20 mg total) by mouth daily as needed for fluid or edema.   latanoprost (XALATAN) 0.005 % ophthalmic solution Place 1 drop into both eyes at bedtime.   metoprolol succinate (TOPROL-XL) 100 MG 24 hr tablet Take 1 tablet (100 mg total) by mouth daily. Take with or immediately following a meal.    pilocarpine (PILOCAR) 2 % ophthalmic solution Place 1 drop into both eyes 4 (four) times daily.     Allergies:   Amoxicillin, Codeine, and Morphine and related   Social History   Socioeconomic History   Marital status: Married    Spouse name: Not on file   Number of children: Not on file   Years of education: Not on file   Highest education level: Not on file  Occupational History   Not on file  Tobacco Use   Smoking status: Never   Smokeless tobacco: Never  Substance and Sexual Activity   Alcohol use: Not Currently   Drug use: Not Currently   Sexual activity: Not on file  Other Topics Concern   Not on file  Social History Narrative   Not on file   Social Determinants of Health   Financial Resource Strain: Not on file  Food Insecurity: Not on file  Transportation Needs: Not on file  Physical Activity: Not on file  Stress: Not on file  Social Connections: Not on file     Family History: The patient's family history is not on file.  ROS:   Please see the history of present illness.  All other systems reviewed and are negative.  EKGs/Labs/Other Studies Reviewed:    The following studies were reviewed today: ECHO 11/14/21:   1. Left ventricular ejection fraction, by estimation, is 40 to 45%. The  left ventricle has mildly decreased function. The left ventricle  demonstrates global hypokinesis. Left ventricular diastolic parameters are  indeterminate.   2. Right ventricular systolic function is mildly reduced. The right  ventricular size is mildly enlarged. There is mildly elevated pulmonary  artery systolic pressure. The estimated right ventricular systolic  pressure is 41.4 mmHg.   3. Left atrial size was moderately dilated.   4. Right atrial size was moderately dilated.   5. The mitral valve is abnormal. Moderate mitral valve regurgitation. No  evidence of mitral stenosis.   6. The tricuspid valve is abnormal. Tricuspid valve regurgitation is  moderate.    7. The aortic valve is tricuspid. Aortic valve regurgitation is trivial.  No aortic stenosis is present.   8. The inferior vena cava is dilated in size with <50% respiratory  variability, suggesting right atrial pressure of 15 mmHg.   9. The patient was in atrial fibrillation.     EKG: EMS strip A-fib 134  Recent Labs: 11/13/2021: B Natriuretic Peptide 273.9; Magnesium 2.1; TSH 0.704 11/14/2021: Hemoglobin 13.4; Platelets 281 11/22/2021: BUN 11; Creatinine, Ser 0.78; Potassium 3.7; Sodium 135  Recent Lipid Panel No results found for: "CHOL", "TRIG", "HDL", "CHOLHDL", "VLDL", "LDLCALC", "LDLDIRECT"   Risk Assessment/Calculations:    CHA2DS2-VASc Score = 5   This indicates a 7.2% annual risk of stroke. The patient's score is based upon: CHF History: 1 HTN History: 1 Diabetes History: 0 Stroke History: 0 Vascular Disease History: 0 Age Score: 2 Gender Score: 1              Physical Exam:    VS:  BP 120/80 (BP Location: Left Arm, Patient Position: Sitting, Cuff Size: Normal)   Pulse 70   Ht 4\' 11"  (1.499 m)   Wt 121 lb (54.9 kg)   BMI 24.44 kg/m     Wt Readings from Last 3 Encounters:  12/30/21 121 lb (54.9 kg)  11/22/21 117 lb (53.1 kg)  11/18/21 118 lb 3.2 oz (53.6 kg)     GEN:  Well nourished, well developed in no acute distress, short stature HEENT: Normal NECK: No JVD; No carotid bruits LYMPHATICS: No lymphadenopathy CARDIAC: irreg ,  tachycardic no murmurs, no rubs, gallops RESPIRATORY:  Clear to auscultation without rales, wheezing or rhonchi  ABDOMEN: Soft, non-tender, non-distended MUSCULOSKELETAL:  No edema; No deformity  SKIN: Warm and dry NEUROLOGIC:  Alert and oriented x 3 PSYCHIATRIC:  Normal affect   ASSESSMENT:    1. Medication management   2. Atrial fibrillation with RVR (HCC)   3. Nonischemic cardiomyopathy (HCC)     PLAN:    In order of problems listed above:   Persistent atrial fibrillation - Previously increase diltiazem all  the way to 240 mg twice a day.  This was still not quite effective.  She has been decreasing the dose.  She has been feeling poorly.  Heart rate currently 134. - I am going to stop her diltiazem and start Toprol-XL 100 mg a day.     Trying to obtain heart rate less than 110 bpm.  She has felt poorly. Watch for signs of bradycardia.  Warning signs.  -Does not desire to take anticoagulation.  Understands potential risks of stroke.  She is continuing with aspirin.  She has sustained  a fall in the past. -EMS EKG strip reviewed personally.  Interpreted.  A-fib 134   Nonischemic cardiomyopathy - Possibly tachycardia induced.  She is taking Lasix 20 mg twice a day.  Could go to once a day as long as she watches her edema and weight.  She adjust this and takes an extra 1 periodically.   She does not desire to go back to the hospital.  This is why we are had close follow-up and trying to avoid hospitalization.  Overall has felt poorly at times.  Palliative care discussion.  Hopefully, palliative care can come to their house and discussed with them their options.     Hypertension - Blood pressure has been elevated.  It is continuing to improve.  Stable currently.  Hypokalemia - Checking basic metabolic profile today.  If necessary, we will prescribe powdered supplement of potassium chloride.  She is unable to take the larger pills.  She is drinking coconut water and bananas.  Anxiety - Perhaps an SSRI or anxiolytic may be helpful for her.  I did ask her to address this with Dr. Jeannetta Nap.  1 week follow-up to assess rate control of atrial fibrillation      Medication Adjustments/Labs and Tests Ordered: Current medicines are reviewed at length with the patient today.  Concerns regarding medicines are outlined above.  Orders Placed This Encounter  Procedures   CBC   Basic metabolic panel   Amb Referral to Palliative Care   Meds ordered this encounter  Medications   metoprolol succinate  (TOPROL-XL) 100 MG 24 hr tablet    Sig: Take 1 tablet (100 mg total) by mouth daily. Take with or immediately following a meal.    Dispense:  90 tablet    Refill:  3    Patient Instructions  Medication Instructions:  Please discontinue your Diltiazem. Start Metoprolol Succinate 100 mg once daily. Continue all other medications as listed.  *If you need a refill on your cardiac medications before your next appointment, please call your pharmacy*   Lab Work: Please have blood work today (CBC, BMP)  If you have labs (blood work) drawn today and your tests are completely normal, you will receive your results only by: MyChart Message (if you have MyChart) OR A paper copy in the mail If you have any lab test that is abnormal or we need to change your treatment, we will call you to review the results.  You have been referred to Palliative Care and will be contacted by them to schedule. [facility] ([phone])   Follow-Up: At [facility], you and your health needs are our priority.  As part of our continuing mission to provide you with exceptional heart care, we have created designated Provider Care Teams.  These Care Teams include your primary Cardiologist (physician) and Advanced Practice Providers (APPs -  Physician Assistants and Nurse Practitioners) who all work together to provide you with the care you need, when you need it.  We recommend signing up for the patient portal called "MyChart".  Sign up information is provided on this After Visit Summary.  MyChart is used to connect with patients for Virtual Visits (Telemedicine).  Patients are able to view lab/test results, encounter notes, upcoming appointments, etc.  Non-urgent messages can be sent to your provider as well.   To learn more about what you can do with MyChart, go to [email or web address].    Your next appointment:   1 week(s)  The format for your next appointment:  In Person  Provider:   Dr Candee Furbish     Important Information About Sugar         Signed, Candee Furbish, MD  12/30/2021 10:49 AM    [place]

## 2021-12-30 NOTE — Telephone Encounter (Signed)
Christy with [facility] calling to verify if the patient is needing palliative care or hospice. She says everything in the notes they received says palliative care, but it was sent to hospice. She says she does not want to reach out to the patient's family and concern them if this is for palliative care and not hospice. Phone: [phone]

## 2021-12-30 NOTE — Patient Instructions (Signed)
Medication Instructions:  Please discontinue your Diltiazem. Start Metoprolol Succinate 100 mg once daily. Continue all other medications as listed.  *If you need a refill on your cardiac medications before your next appointment, please call your pharmacy*   Lab Work: Please have blood work today (CBC, BMP)  If you have labs (blood work) drawn today and your tests are completely normal, you will receive your results only by: MyChart Message (if you have MyChart) OR A paper copy in the mail If you have any lab test that is abnormal or we need to change your treatment, we will call you to review the results.  You have been referred to Palliative Care and will be contacted by them to schedule. [facility] [phone])   Follow-Up: At [facility], you and your health needs are our priority.  As part of our continuing mission to provide you with exceptional heart care, we have created designated Provider Care Teams.  These Care Teams include your primary Cardiologist (physician) and Advanced Practice Providers (APPs -  Physician Assistants and Nurse Practitioners) who all work together to provide you with the care you need, when you need it.  We recommend signing up for the patient portal called "MyChart".  Sign up information is provided on this After Visit Summary.  MyChart is used to connect with patients for Virtual Visits (Telemedicine).  Patients are able to view lab/test results, encounter notes, upcoming appointments, etc.  Non-urgent messages can be sent to your provider as well.   To learn more about what you can do with MyChart, go to [email or web address].    Your next appointment:   1 week(s)  The format for your next appointment:   In Person  Provider:   Dr Donato Schultz     Important Information About Sugar

## 2021-12-31 ENCOUNTER — Telehealth: Payer: Self-pay

## 2021-12-31 DIAGNOSIS — I4891 Unspecified atrial fibrillation: Secondary | ICD-10-CM

## 2021-12-31 DIAGNOSIS — I428 Other cardiomyopathies: Secondary | ICD-10-CM

## 2021-12-31 NOTE — Telephone Encounter (Signed)
Left message for [facility] to initiate hospice services in the home for patient per Dr. Judd Gaudier orders.  Also made [employer], community liaison with [facility] aware of the referral.

## 2021-12-31 NOTE — Telephone Encounter (Signed)
Spoke with patient's daughter Amy. She states patient's cardiologist has sent a order for Hospice she just spoke with the nurse at Dr. Chales Abrahams office. Will close Palliative referral.

## 2022-01-06 ENCOUNTER — Ambulatory Visit: Payer: Medicare PPO | Admitting: Cardiology

## 2022-01-08 ENCOUNTER — Telehealth: Payer: Self-pay | Admitting: Cardiology

## 2022-01-08 NOTE — Telephone Encounter (Signed)
Spoke with [facility] nurse.  Pt stopped taking Metoprolol 100 mg as ordered because she didn't feel good taking it.  Per Seth Bake, RN pt's HR has been 122-136 bpm and pt is having SOB and dusky lips.  She is wearing 02 via n/c at times.  Family has been holding the Metoprolol but giving her Furosemide 60 mg a day because of her SOB.  Thompson Caul I have had a discuss regarding this with pt's daughter Amy and advised the SOB is more from her elevated HR (At Fib) than from fluid overload and it is more important to control the heart rate at this point.  Regardless of that information family continues to give medications they feel will be helpful to pt.  Thompson Caul that pt can try to take the 25 mg tabs of Metoprolol she has at home as it may help some.  Will leave medication list as is at this time since pt/family as taking medications as they see fit.  Will forward to Dr Marlou Porch for his information/knowledge.  Seth Bake will call back as necessary.

## 2022-01-08 NOTE — Telephone Encounter (Signed)
Pt c/o medication issue:  1. Name of Medication: metoprolol succinate (TOPROL-XL) 100 MG 24 hr tablet  2. How are you currently taking this medication (dosage and times per day)? Been off for 6 days   3. Are you having a reaction (difficulty breathing--STAT)? No  4. What is your medication issue? Hospice care nurse states that patient did not feel well taking this medication and stopped taking medication altogether. She states that the pt and family do not want to go back on it, but her HR has been running high and also states that they have 25 mg in home that she believes may be better suited for patient 2 x's daily. Requesting call back.

## 2022-01-19 DEATH — deceased

## 2022-02-03 ENCOUNTER — Ambulatory Visit: Payer: Medicare PPO | Admitting: Cardiology

## 2022-02-18 ENCOUNTER — Telehealth: Payer: Self-pay | Admitting: Cardiology

## 2022-02-18 NOTE — Telephone Encounter (Signed)
Spoke with pt's daughter, Amy (OK per [facility]) who is reporting pt passed away 14-Jan-2023 at home.  Amy is tearful and questioning if they had made the right decision r/t their mom's care.  Reassurance given. Suggested grief counseling, reliance on her religous beliefs, family and friends for support.

## 2022-02-18 NOTE — Telephone Encounter (Signed)
Pt's daughter would like a callback from nurse. Please advise
